# Patient Record
Sex: Male | Born: 1985 | Race: Black or African American | Hispanic: No | Marital: Married | Smoking: Never smoker
Health system: Southern US, Community
[De-identification: ages and names within clinical notes are randomized; demographics above are authoritative.]

## PROBLEM LIST (undated history)

## (undated) HISTORY — PX: OTHER SURGICAL HISTORY: SHX169

---

## 2007-05-01 HISTORY — PX: OTHER SURGICAL HISTORY: SHX169

## 2008-01-11 ENCOUNTER — Emergency Department (HOSPITAL_COMMUNITY): Admission: EM | Admit: 2008-01-11 | Discharge: 2008-01-11 | Payer: Self-pay | Admitting: Emergency Medicine

## 2008-01-12 ENCOUNTER — Emergency Department (HOSPITAL_COMMUNITY): Admission: EM | Admit: 2008-01-12 | Discharge: 2008-01-13 | Payer: Self-pay | Admitting: Emergency Medicine

## 2008-07-23 ENCOUNTER — Emergency Department (HOSPITAL_COMMUNITY): Admission: EM | Admit: 2008-07-23 | Discharge: 2008-07-24 | Payer: Self-pay | Admitting: Emergency Medicine

## 2009-02-09 ENCOUNTER — Emergency Department (HOSPITAL_COMMUNITY): Admission: EM | Admit: 2009-02-09 | Discharge: 2009-02-09 | Payer: Self-pay | Admitting: Family Medicine

## 2009-02-09 ENCOUNTER — Ambulatory Visit (HOSPITAL_COMMUNITY): Admission: RE | Admit: 2009-02-09 | Discharge: 2009-02-09 | Payer: Self-pay | Admitting: Family Medicine

## 2009-06-16 ENCOUNTER — Ambulatory Visit: Payer: Self-pay

## 2010-03-21 IMAGING — CT CT MAXILLOFACIAL W/O CM
3 series · 16 of 47 positions shown, 19 images · non-contrast
Comparison: None

CLINICAL DATA: Injury to the right eye.

CT MAXILLOFACIAL WITHOUT CONTRAST
TECHNIQUE: Multidetector CT imaging of the maxillofacial
structures was performed. Multiplanar CT image reconstructions were
also generated.

[Series 2: supine · axial · 0.33mm/px · z∈[+6,+136]mm · 10 of 62 slices shown, 13 images]
[im 5/62  brain]
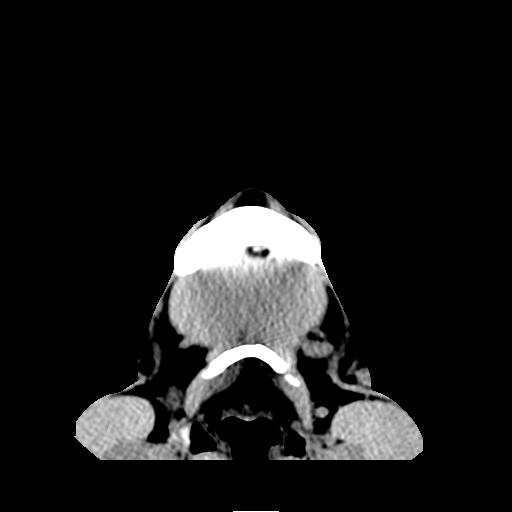
[im 5/62  bone]
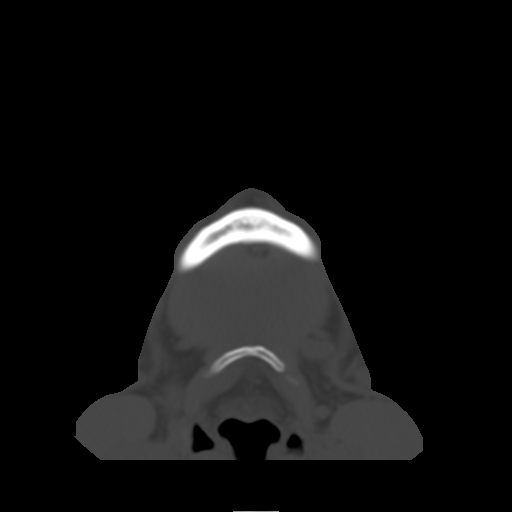
[im 11/62  bone]
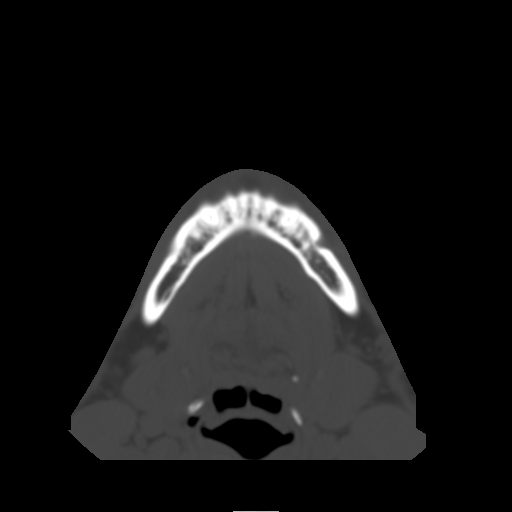
[im 17/62  bone]
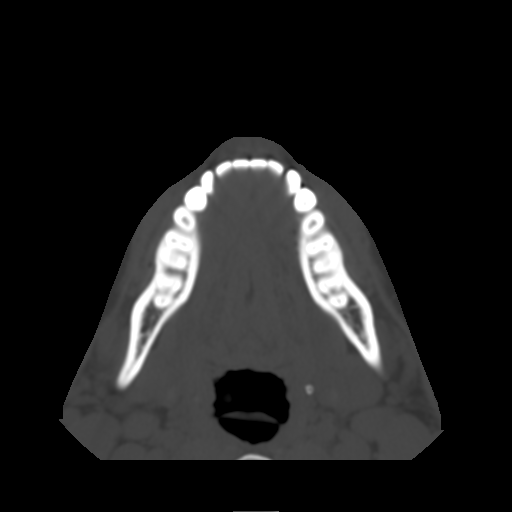
[im 22/62  bone]
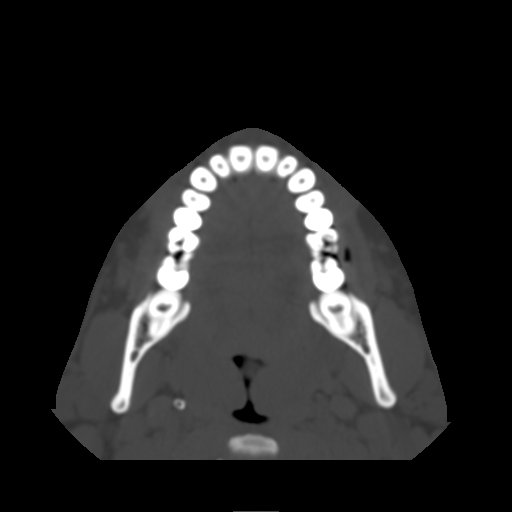
[im 28/62  brain]
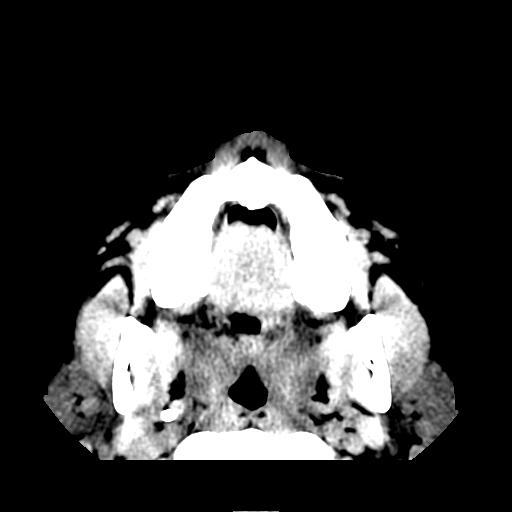
[im 28/62  bone]
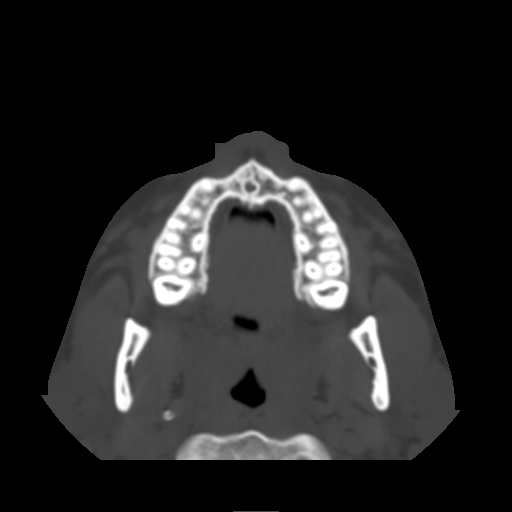
[im 34/62  bone]
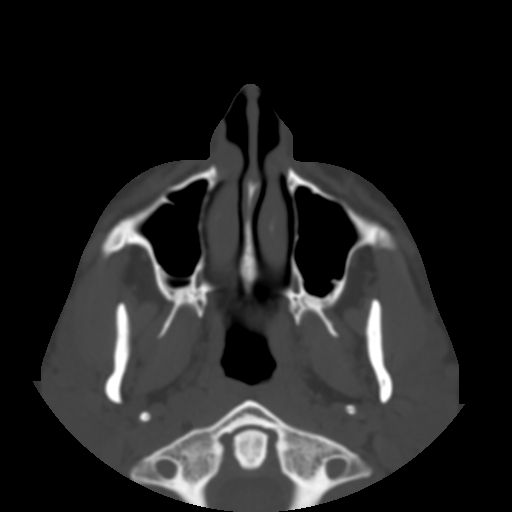
[im 40/62  bone]
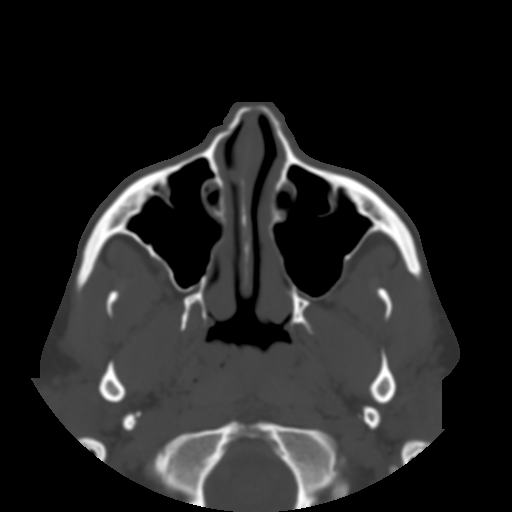
[im 47/62  bone]
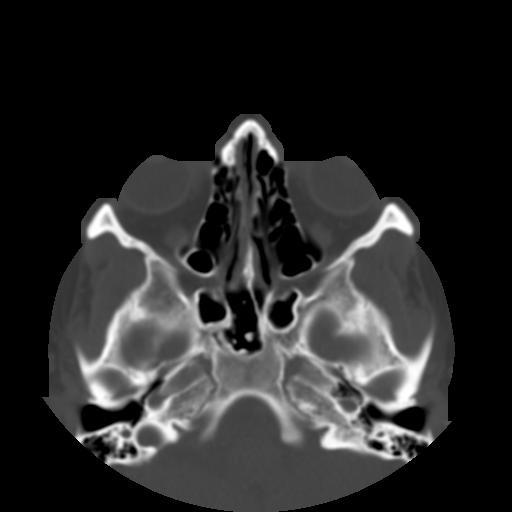
[im 51/62  brain]
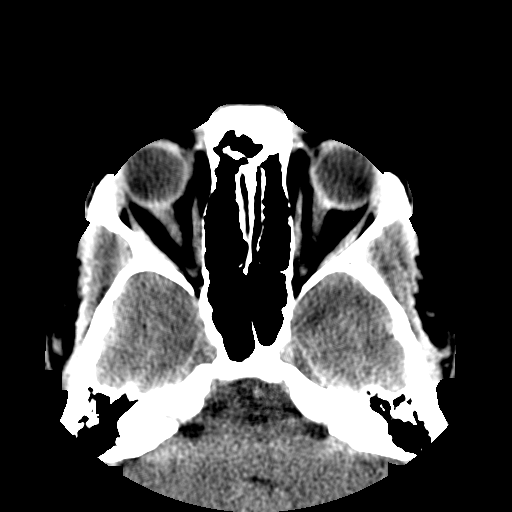
[im 51/62  bone]
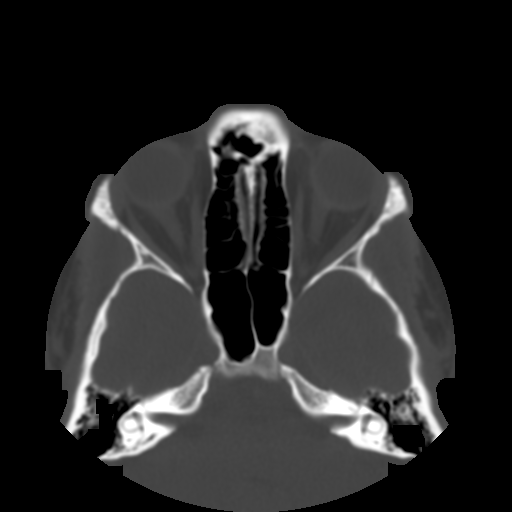
[im 57/62  bone]
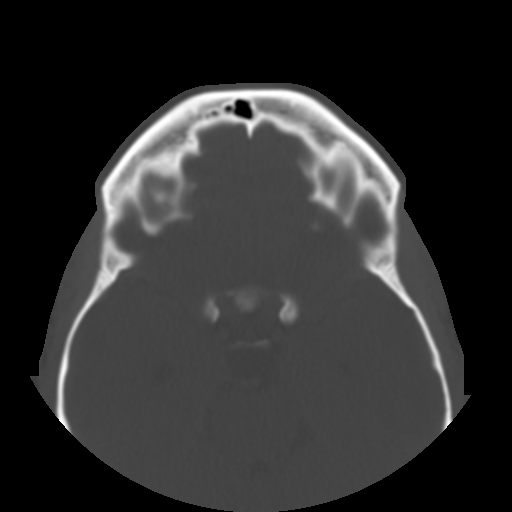

[Series 400: reformatted · sagittal · 0.33mm/px · 3 of 44 slices shown (1 of 2)]
[im 15/44  bone]
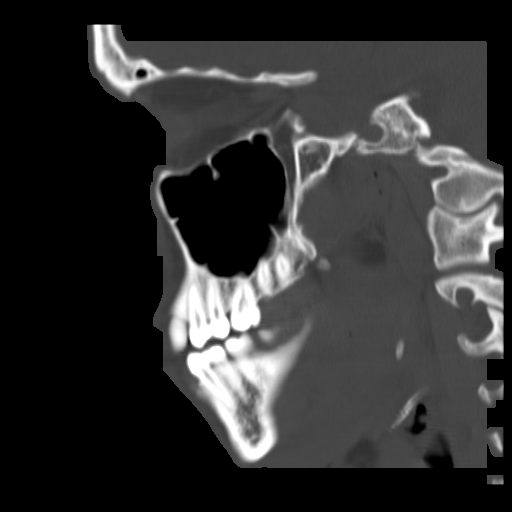
[im 22/44  bone]
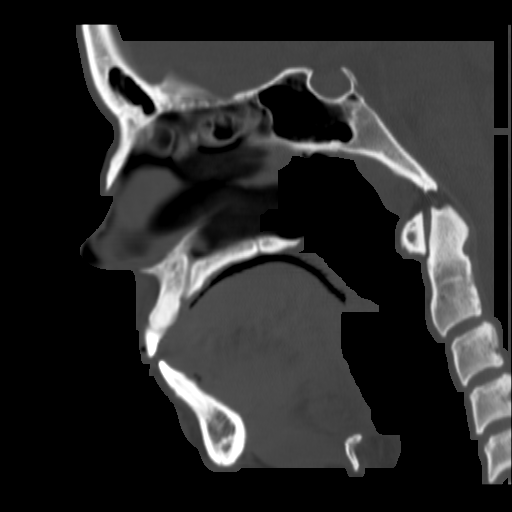
[im 29/44  bone]
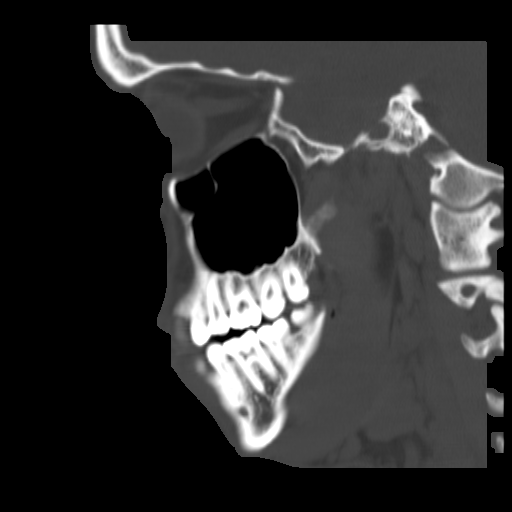

[Series 401: reformatted · coronal · 0.33mm/px · 3 of 35 slices shown (2 of 2)]
[im 12/35  bone]
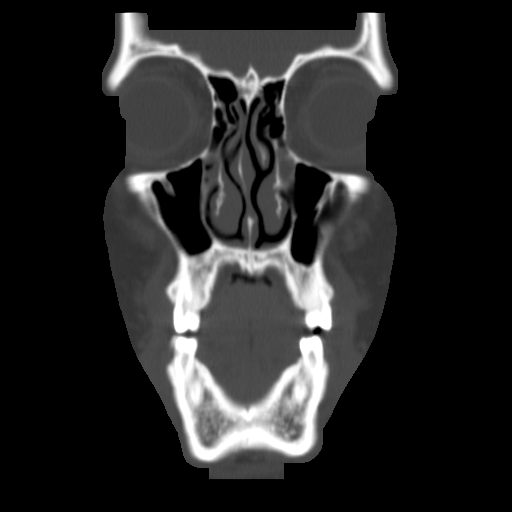
[im 16/35  bone]
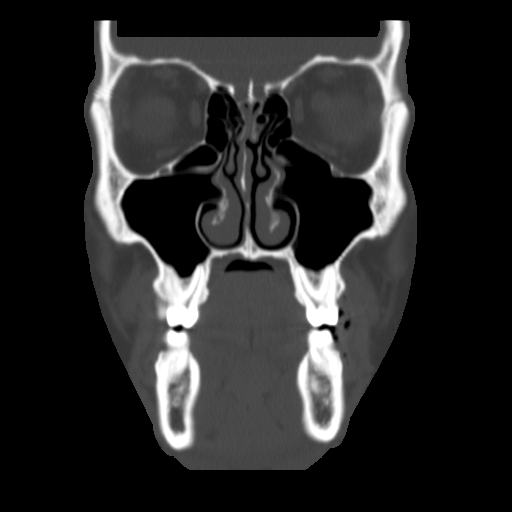
[im 19/35  bone]
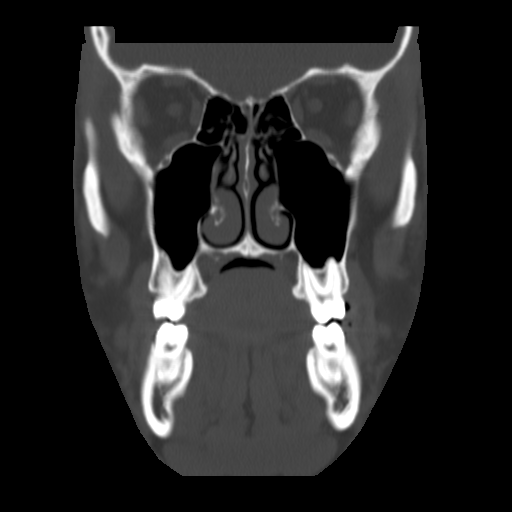

[16 of 47 positions shown; findings below may reference images not displayed]

FINDINGS: The mandibular condyles are located.  The pterygoid
plates are intact.  No evidence for a mandibular fracture.
Visualized mastoid air cells are clear.  There is no significant
paranasal sinus disease.  There is mild deformity of the right
nasal bone and this may represent an old injury.  Negative for a
facial bone fracture.  Both globes are intact.  No evidence for
retrobulbar hemorrhage or edema.  Symmetric appearance of the
extraocular muscles.  Small nodes in the upper neck.  No gross
abnormality to the visualized intracranial structures.  Normal
alignment of the cervical spine from the skull base to C4.  There
is a small curvilinear density along the medial  left orbit that
measures 3 mm.  This density may represent a small calcification or
ossification.
IMPRESSION: No acute bone or soft tissue abnormalities to the face.

Deformity of the right nasal bone may represent an old nasal bone
fracture.

## 2010-05-21 ENCOUNTER — Encounter: Payer: Self-pay | Admitting: Sports Medicine

## 2010-08-10 LAB — RAPID STREP SCREEN (MED CTR MEBANE ONLY): Streptococcus, Group A Screen (Direct): NEGATIVE

## 2018-01-10 ENCOUNTER — Encounter: Payer: Self-pay | Admitting: Family Medicine

## 2018-01-10 ENCOUNTER — Ambulatory Visit: Payer: BLUE CROSS/BLUE SHIELD | Admitting: Family Medicine

## 2018-01-10 VITALS — BP 122/86 | HR 63 | Temp 98.6°F | Ht 75.0 in | Wt 301.4 lb

## 2018-01-10 DIAGNOSIS — Z3141 Encounter for fertility testing: Secondary | ICD-10-CM | POA: Insufficient documentation

## 2018-01-10 DIAGNOSIS — S93691A Other sprain of right foot, initial encounter: Secondary | ICD-10-CM | POA: Insufficient documentation

## 2018-01-10 DIAGNOSIS — M722 Plantar fascial fibromatosis: Secondary | ICD-10-CM | POA: Diagnosis not present

## 2018-01-10 DIAGNOSIS — Z0001 Encounter for general adult medical examination with abnormal findings: Secondary | ICD-10-CM

## 2018-01-10 LAB — URINALYSIS, ROUTINE W REFLEX MICROSCOPIC
Bilirubin Urine: NEGATIVE
HGB URINE DIPSTICK: NEGATIVE
Ketones, ur: NEGATIVE
Leukocytes, UA: NEGATIVE
NITRITE: NEGATIVE
Specific Gravity, Urine: >=1.03 — AB (ref 1.000–1.030)
Total Protein, Urine: NEGATIVE
Urine Glucose: NEGATIVE
Urobilinogen, UA: 0.2 (ref 0.0–1.0)
pH: 6 (ref 5.0–8.0)

## 2018-01-10 LAB — COMPREHENSIVE METABOLIC PANEL
ALBUMIN: 3.6 g/dL (ref 3.5–5.2)
ALT: 30 U/L (ref 0–53)
AST: 23 U/L (ref 0–37)
Alkaline Phosphatase: 69 U/L (ref 39–117)
BUN: 15 mg/dL (ref 6–23)
CALCIUM: 9 mg/dL (ref 8.4–10.5)
CHLORIDE: 109 meq/L (ref 96–112)
CO2: 24 mEq/L (ref 19–32)
Creatinine, Ser: 1.15 mg/dL (ref 0.40–1.50)
GFR: 95.65 mL/min (ref >60.00)
Glucose, Bld: 109 mg/dL — ABNORMAL HIGH (ref 70–99)
POTASSIUM: 4.1 meq/L (ref 3.5–5.1)
Sodium: 141 mEq/L (ref 135–145)
Total Bilirubin: 0.4 mg/dL (ref 0.2–1.2)
Total Protein: 6 g/dL (ref 6.0–8.3)

## 2018-01-10 LAB — CBC
HCT: 47.2 % (ref 39.0–52.0)
HEMOGLOBIN: 16.1 g/dL (ref 13.0–17.0)
MCHC: 34.1 g/dL (ref 30.0–36.0)
MCV: 95.5 fl (ref 78.0–100.0)
Platelets: 233 10*3/uL (ref 150.0–400.0)
RBC: 4.94 Mil/uL (ref 4.22–5.81)
RDW: 12.7 % (ref 11.5–15.5)
WBC: 7.3 10*3/uL (ref 4.0–10.5)

## 2018-01-10 LAB — LIPID PANEL
CHOLESTEROL: 118 mg/dL (ref 0–200)
HDL: 38.4 mg/dL — ABNORMAL LOW (ref >39.00)
LDL CALC: 55 mg/dL (ref 0–99)
NonHDL: 79.7
TRIGLYCERIDES: 124 mg/dL (ref 0.0–149.0)
Total CHOL/HDL Ratio: 3
VLDL: 24.8 mg/dL (ref 0.0–40.0)

## 2018-01-10 LAB — TSH: TSH: 1.23 u[IU]/mL (ref 0.35–4.50)

## 2018-01-10 NOTE — Patient Instructions (Signed)
Health Maintenance, Male A healthy lifestyle and preventive care is important for your health and wellness. Ask your health care provider about what schedule of regular examinations is right for you. What should I know about weight and diet? Eat a Healthy Diet  Eat plenty of vegetables, fruits, whole grains, low-fat dairy products, and lean protein.  Do not eat a lot of foods high in solid fats, added sugars, or salt.  Maintain a Healthy Weight Regular exercise can help you achieve or maintain a healthy weight. You should:  Do at least 150 minutes of exercise each week. The exercise should increase your heart rate and make you sweat (moderate-intensity exercise).  Do strength-training exercises at least twice a week.  Watch Your Levels of Cholesterol and Blood Lipids  Have your blood tested for lipids and cholesterol every 5 years starting at 30 years of age. If you are at high risk for heart disease, you should start having your blood tested when you are 30 years old. You may need to have your cholesterol levels checked more often if: ? Your lipid or cholesterol levels are high. ? You are older than 30 years of age. ? You are at high risk for heart disease.  What should I know about cancer screening? Many types of cancers can be detected early and may often be prevented. Lung Cancer  You should be screened every year for lung cancer if: ? You are a current smoker who has smoked for at least 30 years. ? You are a former smoker who has quit within the past 15 years.  Talk to your health care provider about your screening options, when you should start screening, and how often you should be screened.  Colorectal Cancer  Routine colorectal cancer screening usually begins at 30 years of age and should be repeated every 5-10 years until you are 30 years old. You may need to be screened more often if early forms of precancerous polyps or small growths are found. Your health care provider  may recommend screening at an earlier age if you have risk factors for colon cancer.  Your health care provider may recommend using home test kits to check for hidden blood in the stool.  A small camera at the end of a tube can be used to examine your colon (sigmoidoscopy or colonoscopy). This checks for the earliest forms of colorectal cancer.  Prostate and Testicular Cancer  Depending on your age and overall health, your health care provider may do certain tests to screen for prostate and testicular cancer.  Talk to your health care provider about any symptoms or concerns you have about testicular or prostate cancer.  Skin Cancer  Check your skin from head to toe regularly.  Tell your health care provider about any new moles or changes in moles, especially if: ? There is a change in a mole's size, shape, or color. ? You have a mole that is larger than a pencil eraser.  Always use sunscreen. Apply sunscreen liberally and repeat throughout the day.  Protect yourself by wearing long sleeves, pants, a wide-brimmed hat, and sunglasses when outside.  What should I know about heart disease, diabetes, and high blood pressure?  If you are 18-39 years of age, have your blood pressure checked every 3-5 years. If you are 40 years of age or older, have your blood pressure checked every year. You should have your blood pressure measured twice-once when you are at a hospital or clinic, and once when   you are not at a hospital or clinic. Record the average of the two measurements. To check your blood pressure when you are not at a hospital or clinic, you can use: ? An automated blood pressure machine at a pharmacy. ? A home blood pressure monitor.  Talk to your health care provider about your target blood pressure.  If you are between 45-79 years old, ask your health care provider if you should take aspirin to prevent heart disease.  Have regular diabetes screenings by checking your fasting blood  sugar level. ? If you are at a normal weight and have a low risk for diabetes, have this test once every three years after the age of 45. ? If you are overweight and have a high risk for diabetes, consider being tested at a younger age or more often.  A one-time screening for abdominal aortic aneurysm (AAA) by ultrasound is recommended for men aged 65-75 years who are current or former smokers. What should I know about preventing infection? Hepatitis B If you have a higher risk for hepatitis B, you should be screened for this virus. Talk with your health care provider to find out if you are at risk for hepatitis B infection. Hepatitis C Blood testing is recommended for:  Everyone born from 1945 through 1965.  Anyone with known risk factors for hepatitis C.  Sexually Transmitted Diseases (STDs)  You should be screened each year for STDs including gonorrhea and chlamydia if: ? You are sexually active and are younger than 30 years of age. ? You are older than 30 years of age and your health care provider tells you that you are at risk for this type of infection. ? Your sexual activity has changed since you were last screened and you are at an increased risk for chlamydia or gonorrhea. Ask your health care provider if you are at risk.  Talk with your health care provider about whether you are at high risk of being infected with HIV. Your health care provider may recommend a prescription medicine to help prevent HIV infection.  What else can I do?  Schedule regular health, dental, and eye exams.  Stay current with your vaccines (immunizations).  Do not use any tobacco products, such as cigarettes, chewing tobacco, and e-cigarettes. If you need help quitting, ask your health care provider.  Limit alcohol intake to no more than 2 drinks per day. One drink equals 12 ounces of beer, 5 ounces of wine, or 1 ounces of hard liquor.  Do not use street drugs.  Do not share needles.  Ask your  health care provider for help if you need support or information about quitting drugs.  Tell your health care provider if you often feel depressed.  Tell your health care provider if you have ever been abused or do not feel safe at home. This information is not intended to replace advice given to you by your health care provider. Make sure you discuss any questions you have with your health care provider. Document Released: 10/13/2007 Document Revised: 12/14/2015 Document Reviewed: 01/18/2015 Elsevier Interactive Patient Education  2018 [employer]  Exercising to Lose Weight Exercising can help you to lose weight. In order to lose weight through exercise, you need to do vigorous-intensity exercise. You can tell that you are exercising with vigorous intensity if you are breathing very hard and fast and cannot hold a conversation while exercising. Moderate-intensity exercise helps to maintain your current weight. You can tell that you are exercising   at a moderate level if you have a higher heart rate and faster breathing, but you are still able to hold a conversation. How often should I exercise? Choose an activity that you enjoy and set realistic goals. Your health care provider can help you to make an activity plan that works for you. Exercise regularly as directed by your health care provider. This may include:  Doing resistance training twice each week, such as: ? Push-ups. ? Sit-ups. ? Lifting weights. ? Using resistance bands.  Doing a given intensity of exercise for a given amount of time. Choose from these options: ? 150 minutes of moderate-intensity exercise every week. ? 75 minutes of vigorous-intensity exercise every week. ? A mix of moderate-intensity and vigorous-intensity exercise every week.  Children, pregnant women, people who are out of shape, people who are overweight, and older adults may need to consult a health care provider for individual recommendations. If you have  any sort of medical condition, be sure to consult your health care provider before starting a new exercise program. What are some activities that can help me to lose weight?  Walking at a rate of at least 4.5 miles an hour.  Jogging or running at a rate of 5 miles per hour.  Biking at a rate of at least 10 miles per hour.  Lap swimming.  Roller-skating or in-line skating.  Cross-country skiing.  Vigorous competitive sports, such as football, basketball, and soccer.  Jumping rope.  Aerobic dancing. How can I be more active in my day-to-day activities?  Use the stairs instead of the elevator.  Take a walk during your lunch break.  If you drive, park your car farther away from work or school.  If you take public transportation, get off one stop early and walk the rest of the way.  Make all of your phone calls while standing up and walking around.  Get up, stretch, and walk around every 30 minutes throughout the day. What guidelines should I follow while exercising?  Do not exercise so much that you hurt yourself, feel dizzy, or get very short of breath.  Consult your health care provider prior to starting a new exercise program.  Wear comfortable clothes and shoes with good support.  Drink plenty of water while you exercise to prevent dehydration or heat stroke. Body water is lost during exercise and must be replaced.  Work out until you breathe faster and your heart beats faster. This information is not intended to replace advice given to you by your health care provider. Make sure you discuss any questions you have with your health care provider. Document Released: 05/19/2010 Document Revised: 09/22/2015 Document Reviewed: 09/17/2013 Elsevier Interactive Patient Education  2018 [employer].  Obesity, Adult Obesity is the condition of having too much total body fat. Being overweight or obese means that your weight is greater than what is considered healthy for your  body size. Obesity is determined by a measurement called BMI. BMI is an estimate of body fat and is calculated from height and weight. For adults, a BMI of 30 or higher is considered obese. Obesity can eventually lead to other health concerns and major illnesses, including:  Stroke.  Coronary artery disease (CAD).  Type 2 diabetes.  Some types of cancer, including cancers of the colon, breast, uterus, and gallbladder.  Osteoarthritis.  High blood pressure (hypertension).  High cholesterol.  Sleep apnea.  Gallbladder stones.  Infertility problems.  What are the causes? The main cause of obesity is  taking in (consuming) more calories than your body uses for energy. Other factors that contribute to this condition may include:  Being born with genes that make you more likely to become obese.  Having a medical condition that causes obesity. These conditions include: ? Hypothyroidism. ? Polycystic ovarian syndrome (PCOS). ? Binge-eating disorder. ? Cushing syndrome.  Taking certain medicines, such as steroids, antidepressants, and seizure medicines.  Not being physically active (sedentary lifestyle).  Living where there are limited places to exercise safely or buy healthy foods.  Not getting enough sleep.  What increases the risk? The following factors may increase your risk of this condition:  Having a family history of obesity.  Being a woman of African-American descent.  Being a man of Hispanic descent.  What are the signs or symptoms? Having excessive body fat is the main symptom of this condition. How is this diagnosed? This condition may be diagnosed based on:  Your symptoms.  Your medical history.  A physical exam. Your health care provider may measure: ? Your BMI. If you are an adult with a BMI between 25 and less than 30, you are considered overweight. If you are an adult with a BMI of 30 or higher, you are considered obese. ? The distances around your  hips and your waist (circumferences). These may be compared to each other to help diagnose your condition. ? Your skinfold thickness. Your health care provider may gently pinch a fold of your skin and measure it.  How is this treated? Treatment for this condition often includes changing your lifestyle. Treatment may include some or all of the following:  Dietary changes. Work with your health care provider and a dietitian to set a weight-loss goal that is healthy and reasonable for you. Dietary changes may include eating: ? Smaller portions. A portion size is the amount of a particular food that is healthy for you to eat at one time. This varies from person to person. ? Low-calorie or low-fat options. ? More whole grains, fruits, and vegetables.  Regular physical activity. This may include aerobic activity (cardio) and strength training.  Medicine to help you lose weight. Your health care provider may prescribe medicine if you are unable to lose 1 pound a week after 6 weeks of eating more healthily and doing more physical activity.  Surgery. Surgical options may include gastric banding and gastric bypass. Surgery may be done if: ? Other treatments have not helped to improve your condition. ? You have a BMI of 40 or higher. ? You have life-threatening health problems related to obesity.  Follow these instructions at home:  Eating and drinking   Follow recommendations from your health care provider about what you eat and drink. Your health care provider may advise you to: ? Limit fast foods, sweets, and processed snack foods. ? Choose low-fat options, such as low-fat milk instead of whole milk. ? Eat 5 or more servings of fruits or vegetables every day. ? Eat at home more often. This gives you more control over what you eat. ? Choose healthy foods when you eat out. ? Learn what a healthy portion size is. ? Keep low-fat snacks on hand. ? Avoid sugary drinks, such as soda, fruit juice,  iced tea sweetened with sugar, and flavored milk. ? Eat a healthy breakfast.  Drink enough water to keep your urine clear or pale yellow.  Do not go without eating for long periods of time (do not fast) or follow a fad diet. Fasting and  fad diets can be unhealthy and even dangerous. Physical Activity  Exercise regularly, as told by your health care provider. Ask your health care provider what types of exercise are safe for you and how often you should exercise.  Warm up and stretch before being active.  Cool down and stretch after being active.  Rest between periods of activity. Lifestyle  Limit the time that you spend in front of your TV, computer, or video game system.  Find ways to reward yourself that do not involve food.  Limit alcohol intake to no more than 1 drink a day for nonpregnant women and 2 drinks a day for men. One drink equals 12 oz of beer, 5 oz of wine, or 1 oz of hard liquor. General instructions  Keep a weight loss journal to keep track of the food you eat and how much you exercise you get.  Take over-the-counter and prescription medicines only as told by your health care provider.  Take vitamins and supplements only as told by your health care provider.  Consider joining a support group. Your health care provider may be able to recommend a support group.  Keep all follow-up visits as told by your health care provider. This is important. Contact a health care provider if:  You are unable to meet your weight loss goal after 6 weeks of dietary and lifestyle changes. This information is not intended to replace advice given to you by your health care provider. Make sure you discuss any questions you have with your health care provider. Document Released: 05/24/2004 Document Revised: 09/19/2015 Document Reviewed: 02/02/2015 Elsevier Interactive Patient Education  2018 [employer] Inc.  Plantar Fasciitis Plantar fasciitis is a painful foot condition that affects  the heel. It occurs when the band of tissue that connects the toes to the heel bone (plantar fascia) becomes irritated. This can happen after exercising too much or doing other repetitive activities (overuse injury). The pain from plantar fasciitis can range from mild irritation to severe pain that makes it difficult for you to walk or move. The pain is usually worse in the morning or after you have been sitting or lying down for a while. What are the causes? This condition may be caused by:  Standing for long periods of time.  Wearing shoes that do not fit.  Doing high-impact activities, including running, aerobics, and ballet.  Being overweight.  Having an abnormal way of walking (gait).  Having tight calf muscles.  Having high arches in your feet.  Starting a new athletic activity.  What are the signs or symptoms? The main symptom of this condition is heel pain. Other symptoms include:  Pain that gets worse after activity or exercise.  Pain that is worse in the morning or after resting.  Pain that goes away after you walk for a few minutes.  How is this diagnosed? This condition may be diagnosed based on your signs and symptoms. Your health care provider will also do a physical exam to check for:  A tender area on the bottom of your foot.  A high arch in your foot.  Pain when you move your foot.  Difficulty moving your foot.  You may also need to have imaging studies to confirm the diagnosis. These can include:  X-rays.  Ultrasound.  MRI.  How is this treated? Treatment for plantar fasciitis depends on the severity of the condition. Your treatment may include:  Rest, ice, and over-the-counter pain medicines to manage your pain.  Exercises to  stretch your calves and your plantar fascia.  A splint that holds your foot in a stretched, upward position while you sleep (night splint).  Physical therapy to relieve symptoms and prevent problems in the  future.  Cortisone injections to relieve severe pain.  Extracorporeal shock wave therapy (ESWT) to stimulate damaged plantar fascia with electrical impulses. It is often used as a last resort before surgery.  Surgery, if other treatments have not worked after 12 months.  Follow these instructions at home:  Take medicines only as directed by your health care provider.  Avoid activities that cause pain.  Roll the bottom of your foot over a bag of ice or a bottle of cold water. Do this for 20 minutes, 3-4 times a day.  Perform simple stretches as directed by your health care provider.  Try wearing athletic shoes with air-sole or gel-sole cushions or soft shoe inserts.  Wear a night splint while sleeping, if directed by your health care provider.  Keep all follow-up appointments with your health care provider. How is this prevented?  Do not perform exercises or activities that cause heel pain.  Consider finding low-impact activities if you continue to have problems.  Lose weight if you need to. The best way to prevent plantar fasciitis is to avoid the activities that aggravate your plantar fascia. Contact a health care provider if:  Your symptoms do not go away after treatment with home care measures.  Your pain gets worse.  Your pain affects your ability to move or do your daily activities. This information is not intended to replace advice given to you by your health care provider. Make sure you discuss any questions you have with your health care provider. Document Released: 01/09/2001 Document Revised: 09/19/2015 Document Reviewed: 02/24/2014 Elsevier Interactive Patient Education  [employer]2018 [employer] Inc.

## 2018-01-10 NOTE — Progress Notes (Addendum)
Subjective:  Patient ID: Vincent Hale, male    DOB: 1985-10-19  Age: 32 y.o. MRN: [id number]  CC: New Patient (Initial Visit) (CPE, not fasting (had a powerbar))   HPI Vincent Hale presents for a complete physical exam and to establish care.  He has some medical issues he would also like to discuss.  He is interested in having his thyroid checked with regards to his weight.  He feels as though he consumes a healthy diet and still has difficulty losing weight.  He goes to the gym sometimes twice a week.  He engages in cardio and weightlifting.  He also has pain in the heel of his right foot that is more pronounced in the morning when he first arises or after sitting.  There is been no injury there.  He lives with his significant other and brother.  He works at [employer].  He drinks alcohol maybe 2 or 3 times a month.  He does not smoke or use illicit drugs.  His father is 58 and has a history of hypertension and is status post MI after surgical procedure.  His mother is in her upper 43s and has arthritis in both of her knees.  He and his significant other are trying to achieve pregnancy and she has asked him to ask me about a sperm analysis.  He tells me that he is pretty easy-going and he takes a lot to get him upset.  History Hassan has no past medical history on file.   He has no past surgical history on file.   His family history is not on file.He reports that he has never smoked. He has never used smokeless tobacco. His alcohol and drug histories are not on file.  No outpatient medications prior to visit.   No facility-administered medications prior to visit.     ROS Review of Systems  Constitutional: Negative for chills, fatigue, fever and unexpected weight change.  HENT: Negative.   Eyes: Negative for photophobia and visual disturbance.  Respiratory: Negative.   Cardiovascular: Negative.   Gastrointestinal: Negative.   Endocrine: Negative for polyphagia and  polyuria.  Genitourinary: Negative.   Musculoskeletal: Positive for arthralgias. Negative for myalgias.  Skin: Negative for rash.  Allergic/Immunologic: Negative for immunocompromised state.  Neurological: Negative for headaches.  Hematological: Negative for adenopathy.  Psychiatric/Behavioral: Negative.     Objective:  BP 122/86 (BP Location: Left Arm, Patient Position: Sitting, Cuff Size: Large)   Pulse 63   Temp 98.6 F (37 C) (Oral)   Ht 6\' 3"  (1.905 m)   Wt (!) 301 lb 6.4 oz (136.7 kg)   SpO2 96%   BMI 37.67 kg/m   Physical Exam  Constitutional: He is oriented to person, place, and time. He appears well-developed and well-nourished. No distress.  HENT:  Head: Normocephalic and atraumatic.  Right Ear: External ear normal.  Left Ear: External ear normal.  Mouth/Throat: Oropharynx is clear and moist. No oropharyngeal exudate.  Eyes: Pupils are equal, round, and reactive to light. Conjunctivae and EOM are normal. Right eye exhibits no discharge. Left eye exhibits no discharge. No scleral icterus.  Neck: Normal range of motion. Neck supple. No JVD present. No tracheal deviation present. No thyromegaly present.  Cardiovascular: Normal rate, regular rhythm and normal heart sounds.  Pulses:      Popliteal pulses are 2+ on the right side.       Dorsalis pedis pulses are 2+ on the right side.  Pulmonary/Chest: Effort normal and  breath sounds normal.  Abdominal: Soft. Bowel sounds are normal. He exhibits no distension and no mass. There is no tenderness. There is no guarding. Hernia confirmed negative in the right inguinal area and confirmed negative in the left inguinal area.  Genitourinary: Penis normal. Right testis shows no mass, no swelling and no tenderness. Right testis is descended. Left testis shows no swelling and no tenderness. Left testis is descended. Circumcised. No hypospadias, penile erythema or penile tenderness. No discharge found.  Musculoskeletal:        Feet:  Lymphadenopathy:    He has no cervical adenopathy. No inguinal adenopathy noted on the right or left side.  Neurological: He is alert and oriented to person, place, and time.  Skin: Skin is warm and dry. He is not diaphoretic.  Psychiatric: He has a normal mood and affect. His behavior is normal.      Assessment & Plan:   Vincent Hale was seen today for new patient (initial visit).  Diagnoses and all orders for this visit:  Encounter for health maintenance examination with abnormal findings -     CBC -     Comprehensive metabolic panel -     HIV Antibody (routine testing w rflx) -     Lipid panel -     TSH -     Urinalysis, Routine w reflex microscopic  Morbid obesity (HCC) -     TSH -     Amb ref to Medical Nutrition Therapy-MNT  Plantar fasciitis, right  Fertility testing -     Semen Analysis, Basic   Vincent RobertAlexander J. Hale does not currently have medications on file.  No orders of the defined types were placed in this encounter.  Will draw labs today for health maintenance.  Patient was given anticipatory guidance on health promotion and disease prevention.  He was given general information on exercising to lose weight.  Advised him to engage in a nonweightbearing aerobic activity.  He was given general information on obesity and plantar fasciitis.  He is interested in pursuing nutritional counseling.  Will order a sperm analysis upon his request.   Follow-up: Return in about 6 months (around 07/11/2018).  Mliss SaxWilliam Alfred Mansur Patti, MD

## 2018-01-11 LAB — HIV ANTIBODY (ROUTINE TESTING W REFLEX): HIV: NONREACTIVE

## 2018-01-13 ENCOUNTER — Encounter: Payer: Self-pay | Admitting: Family Medicine

## 2018-01-13 NOTE — Addendum Note (Signed)
Addended by: Andrez GrimeKREMER, WILLIAM A on: 01/13/2018 10:28 AM   Modules accepted: Orders

## 2018-01-22 ENCOUNTER — Other Ambulatory Visit: Payer: Self-pay | Admitting: Family Medicine

## 2018-01-23 ENCOUNTER — Encounter: Payer: BLUE CROSS/BLUE SHIELD | Attending: Family Medicine | Admitting: Skilled Nursing Facility1

## 2018-01-23 ENCOUNTER — Encounter: Payer: Self-pay | Admitting: Skilled Nursing Facility1

## 2018-01-23 DIAGNOSIS — Z713 Dietary counseling and surveillance: Secondary | ICD-10-CM | POA: Diagnosis present

## 2018-01-23 NOTE — Progress Notes (Signed)
  Assessment:  Primary concerns today: obesity.   Pt states he is not comfortable with his weight. Pt states he has been around 280-300 pounds for the last 3 years. Pt states before that he was about 240 pounds and played basketball every day now he no longer plays basketball. Pt states he would like to be 240-250 pounds.  Pt states he might check in in 2 months.   TANITA  BODY COMP RESULTS  01/23/2018   BMI (kg/m^2) 37.5   Fat Mass (lbs) 121.2   Fat Free Mass (lbs) 178.6   Total Body Water (lbs) 134.6    MEDICATIONS: See List   DIETARY INTAKE:  Usual eating pattern includes 3 meals and 2 snacks per day.  Everyday foods include listed below.  Avoided foods include simple/complex carbohydrates    24-hr recall: sometimes eating out 1 time a week B ( AM): onion jalapeno green peppers poured egg whites maybe 3-4 maybe sometimes a grapefruit Snk ( AM): honey nut cheerios L ( PM): onion jalapano peppers and shrimp or air fried chicken Snk ( PM): grapes  D ( PM): onion jalapano peppers and shrimp or air fried chicken Snk ( PM):  Beverages: gallon of water  Usual physical activity: 5-6 days a week for 60 minutes bike and lifting 5 days weeks  Estimated energy needs: 2000 calories 225 g carbohydrates 150 g protein 56 g fat  Progress Towards Goal(s):  In progress.    Intervention:  Nutrition counseling for weight management. Dietitian educated the pt on the need to consume complex carbohydrate to build musculature and utilize his fat mass.  Goals: -2 cups of vegetables or more if hungry per meal, 3 carb choices per meal, 4 ounces of protein per meal -Increase the intensity of your biking  -Continue your activity 5 days a week  Teaching Method Utilized:  Visual Auditory Hands on  Handouts given during visit include:  Meal ideas sheet  Barriers to learning/adherence to lifestyle change: none identified   Demonstrated degree of understanding via:  Teach Back    Monitoring/Evaluation:  Dietary intake, exercise, and body weight prn.

## 2018-01-27 LAB — SEMEN ANALYSIS, BASIC
Appearance: NORMAL
CONCENTRATION, SPERM: 88.7 x10E6/mL (ref >14.9)
Immotile Sperm: 45 %
Leukocyte Concentration: <1 x10E6/mL (ref <1.00)
Non-Progressive (NP): 7 %
Normal Morphology-Strict: 12 % (ref >3)
Progressive Motility (PR): 48 % (ref >31)
Progressively Motile Sperm: 196.4 x10E6
TIME RECEIVED: 1005
TOTAL MOTILE SPERM: 225.9 x10E6
TOTAL SPERM IN EJACULATE: 408.1 x10E6 (ref >38.9)
Time Collected: 915
Time Since Last Emission: 3 days
Total Motility (PR+NP): 55 % (ref >39)
Volume: 4.6 mL (ref >1.4)
pH: 8.5 (ref >7.1)

## 2018-06-13 ENCOUNTER — Ambulatory Visit: Payer: BLUE CROSS/BLUE SHIELD | Admitting: Family Medicine

## 2018-07-07 ENCOUNTER — Ambulatory Visit: Payer: BLUE CROSS/BLUE SHIELD | Admitting: Family Medicine

## 2018-07-07 ENCOUNTER — Encounter: Payer: Self-pay | Admitting: Family Medicine

## 2018-07-07 VITALS — BP 128/70 | HR 67 | Temp 98.2°F | Ht 75.0 in | Wt 308.5 lb

## 2018-07-07 DIAGNOSIS — H6981 Other specified disorders of Eustachian tube, right ear: Secondary | ICD-10-CM

## 2018-07-07 DIAGNOSIS — H66001 Acute suppurative otitis media without spontaneous rupture of ear drum, right ear: Secondary | ICD-10-CM | POA: Diagnosis not present

## 2018-07-07 DIAGNOSIS — R0981 Nasal congestion: Secondary | ICD-10-CM | POA: Insufficient documentation

## 2018-07-07 DIAGNOSIS — H6991 Unspecified Eustachian tube disorder, right ear: Secondary | ICD-10-CM | POA: Insufficient documentation

## 2018-07-07 MED ORDER — PREDNISONE 10 MG PO TABS: 10.0000 mg | ORAL_TABLET | Freq: Every day | ORAL | 0 refills | Status: AC

## 2018-07-07 MED ORDER — FLUTICASONE PROPIONATE 50 MCG/ACT NA SUSP: 2.0000 | Freq: Every day | NASAL | 6 refills | Status: DC

## 2018-07-07 MED ORDER — CLARITHROMYCIN ER 500 MG PO TB24: 1000.0000 mg | ORAL_TABLET | Freq: Every day | ORAL | 0 refills | Status: AC

## 2018-07-07 NOTE — Patient Instructions (Signed)

## 2018-07-07 NOTE — Progress Notes (Signed)
Established Patient Office Visit  Subjective:  Patient ID: Vincent Hale, male    DOB: May 25, 1985  Age: 33 y.o. MRN: [id number]  CC:  Chief Complaint  Patient presents with  . Ear Pain    right ear, seen at urgent care & given amoxicillin. completed antibiotic and started to feel better, came back a few days later. Has been out of town.     HPI Vincent Hale presents for follow-up of an otitis media affecting his right ear.  It is been treated for 10 days with amoxicillin.  Ear seemed to clear up and then symptoms returned a few days later.  He is having ongoing nasal congestion with this.  Denies fever chills, ear pain postnasal drip cough reactive airway disease.  Hearing remains muffled.  He was given a nasal preparation to use for this.    History reviewed. No pertinent past medical history.  Past Surgical History:  Procedure Laterality Date  . arthroscopic knee surgery    . tear in meniscus  2009    Family History  Problem Relation Age of Onset  . Arthritis Mother   . Heart attack Father   . Hyperlipidemia Father   . Hypertension Father     Social History   Socioeconomic History  . Marital status: Single    Spouse name: Not on file  . Number of children: Not on file  . Years of education: Not on file  . Highest education level: Not on file  Occupational History  . Not on file  Social Needs  . Financial resource strain: Not on file  . Food insecurity:    Worry: Not on file    Inability: Not on file  . Transportation needs:    Medical: Not on file    Non-medical: Not on file  Tobacco Use  . Smoking status: Never Smoker  . Smokeless tobacco: Never Used  Substance and Sexual Activity  . Alcohol use: Not on file  . Drug use: Not on file  . Sexual activity: Not on file  Lifestyle  . Physical activity:    Days per week: Not on file    Minutes per session: Not on file  . Stress: Not on file  Relationships  . Social connections:    Talks on phone:  Not on file    Gets together: Not on file    Attends religious service: Not on file    Active member of club or organization: Not on file    Attends meetings of clubs or organizations: Not on file    Relationship status: Not on file  . Intimate partner violence:    Fear of current or ex partner: Not on file    Emotionally abused: Not on file    Physically abused: Not on file    Forced sexual activity: Not on file  Other Topics Concern  . Not on file  Social History Narrative  . Not on file    No outpatient medications prior to visit.   No facility-administered medications prior to visit.     No Known Allergies  ROS Review of Systems  Constitutional: Negative for chills, diaphoresis, fatigue, fever and unexpected weight change.  HENT: Positive for congestion and hearing loss. Negative for ear pain, postnasal drip, rhinorrhea, sinus pressure, sinus pain, sneezing and sore throat.   Eyes: Negative for photophobia and visual disturbance.  Respiratory: Negative for cough, shortness of breath and wheezing.   Cardiovascular: Negative.   Gastrointestinal: Negative.  Endocrine: Negative for polyphagia and polyuria.  Genitourinary: Negative.   Musculoskeletal: Negative for arthralgias and myalgias.  Allergic/Immunologic: Negative for immunocompromised state.  Neurological: Negative for headaches.  Hematological: Does not bruise/bleed easily.  Psychiatric/Behavioral: Negative.       Objective:    Physical Exam  Constitutional: He is oriented to person, place, and time. He appears well-developed and well-nourished. No distress.  HENT:  Head: Normocephalic and atraumatic.  Right Ear: External ear normal. No foreign bodies. Tympanic membrane is injected, erythematous and retracted. Tympanic membrane is not perforated. A middle ear effusion is present.  Left Ear: Tympanic membrane, external ear and ear canal normal.  Mouth/Throat: Oropharynx is clear and moist. No oropharyngeal  exudate.    Eyes: Pupils are equal, round, and reactive to light. Conjunctivae are normal. Right eye exhibits no discharge. Left eye exhibits no discharge. No scleral icterus.  Neck: Neck supple. No JVD present. No tracheal deviation present. No thyromegaly present.  Cardiovascular: Normal rate, regular rhythm and normal heart sounds.  Pulmonary/Chest: Effort normal and breath sounds normal. No stridor.  Abdominal: Bowel sounds are normal.  Lymphadenopathy:    He has no cervical adenopathy.  Neurological: He is alert and oriented to person, place, and time.  Skin: Skin is warm and dry. He is not diaphoretic.  Psychiatric: He has a normal mood and affect. His behavior is normal.    BP 128/70   Pulse 67   Temp 98.2 F (36.8 C) (Oral)   Ht  (1.905 m)   Wt (!) 308 lb 8 oz (139.9 kg)   SpO2 96%   BMI 38.56 kg/m  Wt Readings from Last 3 Encounters:  07/07/18 (!) 308 lb 8 oz (139.9 kg)  01/23/18 299 lb 12.8 oz (136 kg)  01/10/18 (!) 301 lb 6.4 oz (136.7 kg)   BP Readings from Last 3 Encounters:  07/07/18 128/70  01/10/18 122/86   Guideline developer:  UpToDate (see UpToDate for funding source) Date Released: June 2014  Health Maintenance Due  Topic Date Due  . Janet Berlin  06/12/2006    There are no preventive care reminders to display for this patient.  Lab Results  Component Value Date   TSH 1.23 01/10/2018   Lab Results  Component Value Date   WBC 7.3 01/10/2018   HGB 16.1 01/10/2018   HCT 47.2 01/10/2018   MCV 95.5 01/10/2018   PLT 233.0 01/10/2018   Lab Results  Component Value Date   NA 141 01/10/2018   K 4.1 01/10/2018   CO2 24 01/10/2018   GLUCOSE 109 (H) 01/10/2018   BUN 15 01/10/2018   CREATININE 1.15 01/10/2018   BILITOT 0.4 01/10/2018   ALKPHOS 69 01/10/2018   AST 23 01/10/2018   ALT 30 01/10/2018   PROT 6.0 01/10/2018   ALBUMIN 3.6 01/10/2018   CALCIUM 9.0 01/10/2018   GFR 95.65 01/10/2018   Lab Results  Component Value Date   CHOL  118 01/10/2018   Lab Results  Component Value Date   HDL 38.40 (L) 01/10/2018   Lab Results  Component Value Date   LDLCALC 55 01/10/2018   Lab Results  Component Value Date   TRIG 124.0 01/10/2018   Lab Results  Component Value Date   CHOLHDL 3 01/10/2018   No results found for: HGBA1C    Assessment & Plan:   Problem List Items Addressed This Visit      Nervous and Auditory   Acute suppurative otitis media of right ear without spontaneous rupture  of tympanic membrane - Primary   Relevant Medications   clarithromycin (BIAXIN XL) 500 MG 24 hr tablet   fluticasone (FLONASE) 50 MCG/ACT nasal spray   predniSONE (DELTASONE) 10 MG tablet   ETD (Eustachian tube dysfunction), right   Relevant Medications   fluticasone (FLONASE) 50 MCG/ACT nasal spray   predniSONE (DELTASONE) 10 MG tablet     Other   Nasal congestion   Relevant Medications   fluticasone (FLONASE) 50 MCG/ACT nasal spray   predniSONE (DELTASONE) 10 MG tablet      Meds ordered this encounter  Medications  . clarithromycin (BIAXIN XL) 500 MG 24 hr tablet    Sig: Take 2 tablets (1,000 mg total) by mouth daily for 10 days.    Dispense:  20 tablet    Refill:  0  . fluticasone (FLONASE) 50 MCG/ACT nasal spray    Sig: Place 2 sprays into both nostrils daily.    Dispense:  16 g    Refill:  6  . predniSONE (DELTASONE) 10 MG tablet    Sig: Take 1 tablet (10 mg total) by mouth daily with breakfast for 5 days.    Dispense:  5 tablet    Refill:  0    Follow-up: Return in about 2 weeks (around 07/21/2018).   Patient was given information on eustachian tube dysfunction.

## 2018-08-26 ENCOUNTER — Telehealth: Payer: Self-pay | Admitting: Family Medicine

## 2018-08-26 NOTE — Telephone Encounter (Signed)
I called and spoke to patient. Patient would like to wait and schedule follow up appointment when we are seeing patients in the office.

## 2019-12-02 ENCOUNTER — Encounter: Payer: Self-pay | Admitting: Family Medicine

## 2020-07-11 NOTE — Progress Notes (Signed)
New Patient Office Visit  Subjective:  Patient ID: Vincent Hale, male    DOB: [birth year]  Age: 35 y.o. MRN: [id number]  CC:  Chief Complaint  Patient presents with  . Establish Care    HPI Vincent Hale presents to establish care.   Dentist: overdue, next on to do list Eye: overdue, no correction, no concerns Exercise: lifts weights and cardio 4-5 times weekly Diet: cutting back on sugars for the last month, eats twice a day (11am and 6pm) baked chicken and veggies, eggs, [place] bacon COVID: done, boosted  Right knee pain- history of meniscal tear and arthroscopic surgery. Recently has begun hurting again. Plays basketball and exercises regularly. About 2 months ago, had an episode of locking up and hyperextending it so has recently cut back on activities that stress that knee.   Left low back pain for the last few weeks. No known injury or strain. No radiculopathy symptoms or red flags.   History reviewed. No pertinent past medical history.  Past Surgical History:  Procedure Laterality Date  . arthroscopic knee surgery    . tear in meniscus  2009    Family History  Problem Relation Age of Onset  . Arthritis Mother   . Heart attack Father   . Hyperlipidemia Father   . Hypertension Father     Social History   Socioeconomic History  . Marital status: Married    Spouse name: Not on file  . Number of children: Not on file  . Years of education: Not on file  . Highest education level: Not on file  Occupational History  . Not on file  Tobacco Use  . Smoking status: Never Smoker  . Smokeless tobacco: Never Used  Vaping Use  . Vaping Use: Never used  Substance and Sexual Activity  . Alcohol use: Yes    Comment: occasionally  . Drug use: Never  . Sexual activity: Yes    Birth control/protection: None  Other Topics Concern  . Not on file  Social History Narrative  . Not on file   Social Determinants of Health   Financial Resource Strain: Not on file   Food Insecurity: Not on file  Transportation Needs: Not on file  Physical Activity: Not on file  Stress: Not on file  Social Connections: Not on file  Intimate Partner Violence: Not on file    ROS Review of Systems  Constitutional: Negative for chills, fatigue, fever and unexpected weight change.  HENT: Negative for congestion, rhinorrhea, sinus pressure and sore throat.   Eyes: Negative for visual disturbance.  Respiratory: Negative for cough, chest tightness, shortness of breath and wheezing.   Cardiovascular: Negative for chest pain, palpitations and leg swelling.  Gastrointestinal: Negative for abdominal pain, constipation, diarrhea, nausea and vomiting.  Genitourinary: Negative for dysuria, frequency and urgency.  Musculoskeletal: Positive for arthralgias (right knee) and back pain (left lower lumbar).  Skin: Negative for rash.  Allergic/Immunologic: Negative for environmental allergies and food allergies.  Neurological: Positive for headaches. Negative for dizziness and light-headedness.  Psychiatric/Behavioral: Negative for dysphoric mood, self-injury, sleep disturbance and suicidal ideas. The patient is not nervous/anxious.     Objective:   Today's Vitals: BP 136/72   Pulse 71   Temp 98.6 F (37 C)   Ht 6\' 4"  (1.93 m)   Wt (!) 313 lb 4.8 oz (142.1 kg)   SpO2 96%   BMI 38.14 kg/m   Physical Exam Vitals and nursing note reviewed.  Constitutional:  General: He is not in acute distress.    Appearance: Normal appearance.  HENT:     Head: Normocephalic and atraumatic.     Right Ear: Tympanic membrane, ear canal and external ear normal. There is no impacted cerumen.     Left Ear: Tympanic membrane, ear canal and external ear normal. There is no impacted cerumen.  Neck:     Vascular: No carotid bruit.  Cardiovascular:     Rate and Rhythm: Normal rate and regular rhythm.     Pulses: Normal pulses.     Heart sounds: Normal heart sounds. No murmur heard. No  friction rub. No gallop.   Pulmonary:     Effort: Pulmonary effort is normal. No respiratory distress.     Breath sounds: Normal breath sounds.  Abdominal:     General: Bowel sounds are normal. There is no distension.     Palpations: Abdomen is soft. There is no mass.     Tenderness: There is no abdominal tenderness. There is no guarding or rebound.     Hernia: No hernia is present.  Musculoskeletal:        General: Normal range of motion.     Cervical back: Neck supple. No rigidity or tenderness.  Lymphadenopathy:     Cervical: No cervical adenopathy.  Skin:    General: Skin is warm and dry.  Neurological:     Mental Status: He is alert and oriented to person, place, and time.  Psychiatric:        Mood and Affect: Mood normal.        Behavior: Behavior normal.        Thought Content: Thought content normal.        Judgment: Judgment normal.    Assessment & Plan:   1. Encounter to establish care Reviewed available information and discussed health care concerns with patient.  2. Annual physical exam Checking labs. Up to date on other preventative care.  - CBC with Differential/Platelet - COMPLETE METABOLIC PANEL WITH GFR - Lipid panel  3. Acute pain of right knee Getting updated images of the right knee. Starting Meloxicam. Continue conservative measures. If no improvement in symptoms, consider following up with Dr. Karie Schwalbe for further evaluation and treatment. - DG Knee Complete 4 Views Right; Future  4. SI (sacroiliac) joint dysfunction Starting Meloxicam. Recommend stretching before and after exercises. Continue conservative measures.   Outpatient Encounter Medications as of 07/12/2020  Medication Sig  . meloxicam (MOBIC) 15 MG tablet Take 1 tablet (15 mg total) by mouth daily.  . [DISCONTINUED] fluticasone (FLONASE) 50 MCG/ACT nasal spray Place 2 sprays into both nostrils daily.   No facility-administered encounter medications on file as of 07/12/2020.    Follow-up:  Return in about 1 year (around 07/12/2021) for annual physical exam or sooner if needed.   Thayer Ohm, DNP, APRN, FNP-BC [facility]

## 2020-07-12 ENCOUNTER — Encounter: Payer: Self-pay | Admitting: Medical-Surgical

## 2020-07-12 ENCOUNTER — Ambulatory Visit: Payer: No Typology Code available for payment source | Admitting: Medical-Surgical

## 2020-07-12 ENCOUNTER — Ambulatory Visit (INDEPENDENT_AMBULATORY_CARE_PROVIDER_SITE_OTHER): Payer: No Typology Code available for payment source

## 2020-07-12 ENCOUNTER — Other Ambulatory Visit: Payer: Self-pay

## 2020-07-12 VITALS — BP 136/72 | HR 71 | Temp 98.6°F | Ht 76.0 in | Wt 313.3 lb

## 2020-07-12 DIAGNOSIS — Z7689 Persons encountering health services in other specified circumstances: Secondary | ICD-10-CM | POA: Diagnosis not present

## 2020-07-12 DIAGNOSIS — M25561 Pain in right knee: Secondary | ICD-10-CM

## 2020-07-12 DIAGNOSIS — Z1159 Encounter for screening for other viral diseases: Secondary | ICD-10-CM

## 2020-07-12 DIAGNOSIS — Z0000 Encounter for general adult medical examination without abnormal findings: Secondary | ICD-10-CM | POA: Diagnosis not present

## 2020-07-12 DIAGNOSIS — M533 Sacrococcygeal disorders, not elsewhere classified: Secondary | ICD-10-CM | POA: Diagnosis not present

## 2020-07-12 DIAGNOSIS — Z114 Encounter for screening for human immunodeficiency virus [HIV]: Secondary | ICD-10-CM

## 2020-07-12 LAB — COMPLETE METABOLIC PANEL WITH GFR
AG Ratio: 1.5 (calc) (ref 1.0–2.5)
ALT: 64 U/L — ABNORMAL HIGH (ref 9–46)
AST: 33 U/L (ref 10–40)
Albumin: 4.3 g/dL (ref 3.6–5.1)
Alkaline phosphatase (APISO): 92 U/L (ref 36–130)
BUN: 18 mg/dL (ref 7–25)
CO2: 27 mmol/L (ref 20–32)
Calcium: 9.7 mg/dL (ref 8.6–10.3)
Chloride: 106 mmol/L (ref 98–110)
Creat: 1.01 mg/dL (ref 0.60–1.35)
GFR, Est African American: 113 mL/min/{1.73_m2} (ref >=60)
GFR, Est Non African American: 97 mL/min/{1.73_m2} (ref >=60)
Globulin: 2.9 g/dL (calc) (ref 1.9–3.7)
Glucose, Bld: 88 mg/dL (ref 65–99)
Potassium: 4.8 mmol/L (ref 3.5–5.3)
Sodium: 140 mmol/L (ref 135–146)
Total Bilirubin: 0.4 mg/dL (ref 0.2–1.2)
Total Protein: 7.2 g/dL (ref 6.1–8.1)

## 2020-07-12 LAB — LIPID PANEL
Cholesterol: 166 mg/dL (ref <200)
HDL: 47 mg/dL (ref >=40)
LDL Cholesterol (Calc): 99 mg/dL (calc)
Non-HDL Cholesterol (Calc): 119 mg/dL (calc) (ref <130)
Total CHOL/HDL Ratio: 3.5 (calc) (ref <5.0)
Triglycerides: 102 mg/dL (ref <150)

## 2020-07-12 LAB — CBC WITH DIFFERENTIAL/PLATELET
Absolute Monocytes: 657 cells/uL (ref 200–950)
Basophils Absolute: 60 cells/uL (ref 0–200)
Basophils Relative: 0.9 %
Eosinophils Absolute: 101 cells/uL (ref 15–500)
Eosinophils Relative: 1.5 %
HCT: 47.4 % (ref 38.5–50.0)
Hemoglobin: 16.1 g/dL (ref 13.2–17.1)
Lymphs Abs: 1903 cells/uL (ref 850–3900)
MCH: 32.1 pg (ref 27.0–33.0)
MCHC: 34 g/dL (ref 32.0–36.0)
MCV: 94.6 fL (ref 80.0–100.0)
MPV: 11.7 fL (ref 7.5–12.5)
Monocytes Relative: 9.8 %
Neutro Abs: 3980 cells/uL (ref 1500–7800)
Neutrophils Relative %: 59.4 %
Platelets: 226 10*3/uL (ref 140–400)
RBC: 5.01 10*6/uL (ref 4.20–5.80)
RDW: 12.7 % (ref 11.0–15.0)
Total Lymphocyte: 28.4 %
WBC: 6.7 10*3/uL (ref 3.8–10.8)

## 2020-07-12 MED ORDER — MELOXICAM 15 MG PO TABS: 15.0000 mg | ORAL_TABLET | Freq: Every day | ORAL | 1 refills | Status: DC

## 2020-07-12 NOTE — Patient Instructions (Signed)
Preventive Care 21-33 Years Old, Male Preventive care refers to lifestyle choices and visits with your health care provider that can promote health and wellness. This includes:  A yearly physical exam. This is also called an annual wellness visit.  Regular dental and eye exams.  Immunizations.  Screening for certain conditions.  Healthy lifestyle choices, such as: ? Eating a healthy diet. ? Getting regular exercise. ? Not using drugs or products that contain nicotine and tobacco. ? Limiting alcohol use. What can I expect for my preventive care visit? Physical exam Your health care provider may check your:  Height and weight. These may be used to calculate your BMI (body mass index). BMI is a measurement that tells if you are at a healthy weight.  Heart rate and blood pressure.  Body temperature.  Skin for abnormal spots. Counseling Your health care provider may ask you questions about your:  Past medical problems.  Family's medical history.  Alcohol, tobacco, and drug use.  Emotional well-being.  Home life and relationship well-being.  Sexual activity.  Diet, exercise, and sleep habits.  Work and work environment.  Access to firearms. What immunizations do I need? Vaccines are usually given at various ages, according to a schedule. Your health care provider will recommend vaccines for you based on your age, medical history, and lifestyle or other factors, such as travel or where you work.   What tests do I need? Blood tests  Lipid and cholesterol levels. These may be checked every 5 years starting at age 20.  Hepatitis C test.  Hepatitis B test. Screening  Diabetes screening. This is done by checking your blood sugar (glucose) after you have not eaten for a while (fasting).  Genital exam to check for testicular cancer or hernias.  STD (sexually transmitted disease) testing, if you are at risk. Talk with your health care provider about your test results,  treatment options, and if necessary, the need for more tests.   Follow these instructions at home: Eating and drinking  Eat a healthy diet that includes fresh fruits and vegetables, whole grains, lean protein, and low-fat dairy products.  Drink enough fluid to keep your urine pale yellow.  Take vitamin and mineral supplements as recommended by your health care provider.  Do not drink alcohol if your health care provider tells you not to drink.  If you drink alcohol: ? Limit how much you have to 0-2 drinks a day. ? Be aware of how much alcohol is in your drink. In the [place] one drink equals one 12 oz bottle of beer (355 mL), one 5 oz glass of wine (148 mL), or one 1 oz glass of hard liquor (44 mL).   Lifestyle  Take daily care of your teeth and gums. Brush your teeth every morning and night with fluoride toothpaste. Floss one time each day.  Stay active. Exercise for at least 30 minutes 5 or more days each week.  Do not use any products that contain nicotine or tobacco, such as cigarettes, e-cigarettes, and chewing tobacco. If you need help quitting, ask your health care provider.  Do not use drugs.  If you are sexually active, practice safe sex. Use a condom or other form of protection to prevent STIs (sexually transmitted infections).  Find healthy ways to cope with stress, such as: ? Meditation, yoga, or listening to music. ? Journaling. ? Talking to a trusted person. ? Spending time with friends and family. Safety  Always wear your seat belt while driving   or riding in a vehicle.  Do not drive: ? If you have been drinking alcohol. Do not ride with someone who has been drinking. ? When you are tired or distracted. ? While texting.  Wear a helmet and other protective equipment during sports activities.  If you have firearms in your house, make sure you follow all gun safety procedures.  Seek help if you have been physically or sexually abused. What's next?  Go to your  health care provider once a year for an annual wellness visit.  Ask your health care provider how often you should have your eyes and teeth checked.  Stay up to date on all vaccines. This information is not intended to replace advice given to you by your health care provider. Make sure you discuss any questions you have with your health care provider. Document Revised: 12/31/2018 Document Reviewed: 04/10/2018 Elsevier Patient Education  2021 Elsevier Inc.  

## 2021-04-08 ENCOUNTER — Ambulatory Visit (HOSPITAL_COMMUNITY)
Admission: RE | Admit: 2021-04-08 | Discharge: 2021-04-08 | Disposition: A | Discharge status: Home / Self Care | Payer: 59 | Source: Ambulatory Visit | Attending: Emergency Medicine | Admitting: Emergency Medicine

## 2021-04-08 ENCOUNTER — Encounter (HOSPITAL_COMMUNITY): Payer: Self-pay

## 2021-04-08 ENCOUNTER — Ambulatory Visit (INDEPENDENT_AMBULATORY_CARE_PROVIDER_SITE_OTHER): Payer: 59

## 2021-04-08 ENCOUNTER — Other Ambulatory Visit: Payer: Self-pay

## 2021-04-08 VITALS — BP 135/73 | HR 96 | Temp 98.8°F | Resp 17

## 2021-04-08 DIAGNOSIS — R052 Subacute cough: Secondary | ICD-10-CM

## 2021-04-08 DIAGNOSIS — R059 Cough, unspecified: Secondary | ICD-10-CM

## 2021-04-08 MED ORDER — ALBUTEROL SULFATE HFA 108 (90 BASE) MCG/ACT IN AERS: 1.0000 | INHALATION_SPRAY | Freq: Four times a day (QID) | RESPIRATORY_TRACT | 0 refills | Status: DC | PRN

## 2021-04-08 NOTE — ED Provider Notes (Signed)
[facility]  ____________________________________________  Time seen: Approximately 12:31 PM  I have reviewed the triage vital signs and the nursing notes.   HISTORY  Chief Complaint appt 1200 and Cough   Historian Patient     HPI Vincent Hale is a 35 y.o. male presents to the urgent care with cough for the past 3 months.  Patient was treated with prednisone and Augmentin for sinusitis and noticed no improvement in his cough after treatment.  He states that his cough is usually worse at night.  Denies change in cough with eating.  No fever, chills or shortness of breath.  Patient denies a history of asthma.  No current chest tightness, chest pain or abdominal pain.   History reviewed. No pertinent past medical history.   Immunizations up to date:  Yes.     History reviewed. No pertinent past medical history.  Patient Active Problem List   Diagnosis Date Noted   Acute suppurative otitis media of right ear without spontaneous rupture of tympanic membrane 07/07/2018   ETD (Eustachian tube dysfunction), right 07/07/2018   Nasal congestion 07/07/2018   Fertility testing 01/10/2018   Morbid obesity (HCC) 01/10/2018   Rupture of plantar fascia of right foot 01/10/2018   Plantar fasciitis, right 01/10/2018    Past Surgical History:  Procedure Laterality Date   arthroscopic knee surgery     tear in meniscus  2009    Prior to Admission medications   Medication Sig Start Date End Date Taking? Authorizing Provider  albuterol (VENTOLIN HFA) 108 (90 Base) MCG/ACT inhaler Inhale 1-2 puffs into the lungs every 6 (six) hours as needed for wheezing or shortness of breath. 04/08/21  Yes Pia Mau M, PA-C  meloxicam (MOBIC) 15 MG tablet Take 1 tablet (15 mg total) by mouth daily. 07/12/20   Christen Butter, NP    Allergies Patient has no known allergies.  Family History  Problem Relation Age of Onset   Arthritis Mother    Heart attack Father    Hyperlipidemia  Father    Hypertension Father     Social History Social History   Tobacco Use   Smoking status: Never   Smokeless tobacco: Never  Vaping Use   Vaping Use: Never used  Substance Use Topics   Alcohol use: Yes    Comment: occasionally   Drug use: Never     Review of Systems  Constitutional: No fever/chills Eyes:  No discharge ENT: No upper respiratory complaints. Respiratory: Patient has cough.  Gastrointestinal:   No nausea, no vomiting.  No diarrhea.  No constipation. Musculoskeletal: Negative for musculoskeletal pain. Skin: Negative for rash, abrasions, lacerations, ecchymosis.    ____________________________________________   PHYSICAL EXAM:  VITAL SIGNS: ED Triage Vitals [04/08/21 1207]  Enc Vitals Group     BP 135/73     Pulse Rate 96     Resp 17     Temp 98.8 F (37.1 C)     Temp Source Oral     SpO2 97 %     Weight      Height      Head Circumference      Peak Flow      Pain Score 0     Pain Loc      Pain Edu?      Excl. in GC?      Constitutional: Alert and oriented. Well appearing and in no acute distress. Eyes: Conjunctivae are normal. PERRL. EOMI. Head: Atraumatic. ENT:  Nose: No congestion/rhinnorhea.      Mouth/Throat: Mucous membranes are moist.  Neck: No stridor.  No cervical spine tenderness to palpation. Cardiovascular: Normal rate, regular rhythm. Normal S1 and S2.  Good peripheral circulation. Respiratory: Normal respiratory effort without tachypnea or retractions. Patient has mild expiratory wheeze. Good air entry to the bases with no decreased or absent breath sounds Gastrointestinal: Bowel sounds x 4 quadrants. Soft and nontender to palpation. No guarding or rigidity. No distention. Musculoskeletal: Full range of motion to all extremities. No obvious deformities noted Neurologic:  Normal for age. No gross focal neurologic deficits are appreciated.  Skin:  Skin is warm, dry and intact. No rash noted. Psychiatric: Mood and  affect are normal for age. Speech and behavior are normal.   ____________________________________________   LABS (all labs ordered are listed, but only abnormal results are displayed)  Labs Reviewed - No data to display ____________________________________________  EKG   ____________________________________________  RADIOLOGY   DG Chest 2 View  Result Date: 04/08/2021 CLINICAL DATA:  cough for 3 monthsCough for 3 months EXAM: CHEST - 2 VIEW COMPARISON:  None. FINDINGS: Normal mediastinum and cardiac silhouette. Normal pulmonary vasculature. No evidence of effusion, infiltrate, or pneumothorax. No acute bony abnormality. IMPRESSION: No acute cardiopulmonary process. Electronically Signed   By: Genevive Bi M.D.   On: 04/08/2021 12:53    ____________________________________________    PROCEDURES  Procedure(s) performed:     Procedures     Medications - No data to display   ____________________________________________   INITIAL IMPRESSION / ASSESSMENT AND PLAN / ED COURSE  Pertinent labs & imaging results that were available during my care of the patient were reviewed by me and considered in my medical decision making (see chart for details).      Assessment and Plan: Cough:  Chest x-ray shows no consolidations, opacities, infiltrates, pneumothorax or signs of malignancy.  We will treat with Pepcid and Zyrtec and will prescribe patient an albuterol inhaler.  Return precautions were given to return with new or worsening symptoms.     ____________________________________________  FINAL CLINICAL IMPRESSION(S) / ED DIAGNOSES  Final diagnoses:  Subacute cough      NEW MEDICATIONS STARTED DURING THIS VISIT:  ED Discharge Orders          Ordered    albuterol (VENTOLIN HFA) 108 (90 Base) MCG/ACT inhaler  Every 6 hours PRN        04/08/21 1310                This chart was dictated using voice recognition software/Dragon. Despite best  efforts to proofread, errors can occur which can change the meaning. Any change was purely unintentional.     Orvil Feil, PA-C 04/08/21 1317

## 2021-04-08 NOTE — ED Triage Notes (Signed)
Pt had cough for 3 months. Had different appts in this time and received medicines for cough that helps for short period of time. Cough is worse at night time.

## 2021-04-08 NOTE — Discharge Instructions (Addendum)
Take 40 mg of Pepcid once daily. Take 10 mg of Zyrtec once daily.  Take 2 puffs of Albuterol every four hours for wheezing.

## 2021-05-03 ENCOUNTER — Other Ambulatory Visit: Payer: Self-pay | Admitting: Medical-Surgical

## 2021-05-10 ENCOUNTER — Other Ambulatory Visit: Payer: Self-pay

## 2021-05-10 ENCOUNTER — Encounter (HOSPITAL_COMMUNITY): Payer: Self-pay

## 2021-05-10 ENCOUNTER — Emergency Department (HOSPITAL_COMMUNITY): Payer: 59

## 2021-05-10 ENCOUNTER — Emergency Department (HOSPITAL_COMMUNITY)
Admission: EM | Admit: 2021-05-10 | Discharge: 2021-05-10 | Disposition: A | Discharge status: Home / Self Care | Payer: 59 | Attending: Emergency Medicine | Admitting: Emergency Medicine

## 2021-05-10 DIAGNOSIS — J45901 Unspecified asthma with (acute) exacerbation: Secondary | ICD-10-CM | POA: Insufficient documentation

## 2021-05-10 DIAGNOSIS — R0602 Shortness of breath: Secondary | ICD-10-CM | POA: Diagnosis present

## 2021-05-10 DIAGNOSIS — J4521 Mild intermittent asthma with (acute) exacerbation: Secondary | ICD-10-CM

## 2021-05-10 MED ORDER — DEXAMETHASONE SODIUM PHOSPHATE 10 MG/ML IJ SOLN
10.0000 mg | Freq: Once | INTRAMUSCULAR | Status: AC
  Administered 2021-05-10: 10 mg via INTRAMUSCULAR
  Filled 2021-05-10: qty 1

## 2021-05-10 MED ORDER — ALBUTEROL SULFATE (2.5 MG/3ML) 0.083% IN NEBU
2.5000 mg | INHALATION_SOLUTION | Freq: Once | RESPIRATORY_TRACT | Status: AC
  Administered 2021-05-10: 2.5 mg via RESPIRATORY_TRACT
  Filled 2021-05-10: qty 3

## 2021-05-10 NOTE — ED Triage Notes (Signed)
Pt arrives POV for eval of SOB. Pt reports that he has been SOB x mos, w/ a chronic cough-is being eval'd by several physicians for same w/ no answers at present. Pt reports SOB woke him from sleep tonight and caused him to present here.

## 2021-05-10 NOTE — ED Provider Triage Note (Signed)
Emergency Medicine Provider Triage Evaluation Note  Vincent Hale , a 36 y.o. male  was evaluated in triage.  Pt complains of shortness of breath and wheezing.  Patient states that he was treated for sinus infection about 4 months ago and since then has been dealing with an intermittent productive cough as well as intermittent shortness of breath and wheezing.  He states that in the middle the night he woke up from sleep coughing and became short of breath so he came to the emergency department.  Denies any chest pain.  States he has been evaluated for this multiple times as well as most recently by ENT in December.  He has a follow-up with pulmonology later this month.  Denies a history of smoking or asthma.  Physical Exam  BP 106/84 (BP Location: Right Arm)    Pulse 86    Temp 98.3 F (36.8 C) (Oral)    Resp (!) 25    Ht 6\' 4"  (1.93 m)    Wt (!) 145.2 kg    SpO2 95%    BMI 38.95 kg/m  Gen:   Awake, no distress   Resp:  Normal effort  MSK:   Moves extremities without difficulty  Other:  Expiratory wheezes noted in all lung fields.  Medical Decision Making  Medically screening exam initiated at 3:05 AM.  Appropriate orders placed.  Vincent Hale was informed that the remainder of the evaluation will be completed by another provider, this initial triage assessment does not replace that evaluation, and the importance of remaining in the ED until their evaluation is complete.   Rowe Robert, PA-C 05/10/21 [phone]

## 2021-05-10 NOTE — ED Provider Notes (Signed)
[facility] [place] [facility]PART[facility] Provider Note   CSN: [id number] Arrival date & time: 05/10/21  0239     History  Chief Complaint  Patient presents with   Shortness of Breath    Vincent Hale is a 36 y.o. male with no significant PMHx presents with chronic cough for the past 3 months. Pt endorse associated SOB.  Patient states that in October 2022 he was diagnosed with sinusitis and was given Augmentin and prednisone at that time.  Since then patient's been experiencing a dry cough.  He endorses the coughing is worse at night with associated shortness of breath.  He was seen by his primary care physician in December 2022 for similar symptoms and was prescribed albuterol inhaler, Pepcid, Zyrtec, montelukast.  He endorsed moderate relief with albuterol inhaler.  He states he uses his inhaler up to 3-5 times per day.  He states that the coughing and shortness of breath is worse at night requiring to use his inhaler at least 2 more times.  He endorsed that he was also prescribed montelukast which he takes daily, with partial relief.  Patient denies lightheadedness, dizziness, fever, chills, chest pain, abdominal pain, or syncopal episodes.    Shortness of Breath Associated symptoms: cough and wheezing   Associated symptoms: no chest pain, no fever, no headaches, no sore throat and no vomiting       Home Medications Prior to Admission medications   Medication Sig Start Date End Date Taking? Authorizing Provider  albuterol (VENTOLIN HFA) 108 (90 Base) MCG/ACT inhaler INHALE 1-2 PUFFS BY MOUTH EVERY 6 HOURS AS NEEDED FOR WHEEZE OR SHORTNESS OF BREATH Patient taking differently: Inhale 1-2 puffs into the lungs every 6 (six) hours as needed for shortness of breath or wheezing. 05/03/21  Yes Samuel Bouche, NP  cetirizine (ZYRTEC) 10 MG tablet Take 10 mg by mouth daily.   Yes [provider]  montelukast (SINGULAIR) 10 MG tablet Take 10 mg by mouth daily. 04/26/21  Yes  [provider]  meloxicam (MOBIC) 15 MG tablet Take 1 tablet (15 mg total) by mouth daily. Patient not taking: Reported on 05/10/2021 07/12/20   Samuel Bouche, NP      Allergies    Patient has no known allergies.    Review of Systems   Review of Systems  Constitutional:  Negative for chills and fever.  HENT:  Negative for sore throat.   Eyes:  Negative for visual disturbance.  Respiratory:  Positive for cough, shortness of breath and wheezing.   Cardiovascular:  Negative for chest pain and palpitations.  Gastrointestinal:  Negative for nausea and vomiting.  Musculoskeletal:  Negative for myalgias.  Neurological:  Negative for dizziness, syncope, light-headedness and headaches.   Physical Exam Updated Vital Signs BP 140/80    Pulse 70    Temp 98 F (36.7 C) (Oral)    Resp 19    Ht 6\' 4"  (1.93 m)    Wt (!) 145.2 kg    SpO2 95%    BMI 38.95 kg/m  Physical Exam Constitutional:      General: He is not in acute distress. HENT:     Head: Normocephalic and atraumatic.  Cardiovascular:     Rate and Rhythm: Normal rate and regular rhythm.     Heart sounds: Normal heart sounds.  Pulmonary:     Effort: Pulmonary effort is normal.     Breath sounds: Normal air entry. Wheezing present.  Abdominal:     General: Abdomen is protuberant.  Palpations: Abdomen is soft.  Musculoskeletal:     Right lower leg: No edema.     Left lower leg: No edema.  Skin:    General: Skin is warm and dry.  Neurological:     General: No focal deficit present.     Mental Status: He is alert.  Psychiatric:        Mood and Affect: Mood normal.        Behavior: Behavior normal. Behavior is cooperative.    ED Results / Procedures / Treatments   Labs (all labs ordered are listed, but only abnormal results are displayed) Labs Reviewed - No data to display  EKG EKG Interpretation  Date/Time:  Wednesday May 10 2021 03:26:01 EST Ventricular Rate:  88 PR Interval:  172 QRS Duration: 82 QT  Interval:  362 QTC Calculation: 438 R Axis:   47 Text Interpretation: Normal sinus rhythm with sinus arrhythmia Normal ECG No previous ECGs available Confirmed by Elnora Morrison [phone]) on 05/10/2021 9:33:32 AM  Radiology DG Chest 2 View  Result Date: 05/10/2021 CLINICAL DATA:  Shortness of breath, wheezing EXAM: CHEST - 2 VIEW COMPARISON:  04/08/2021 FINDINGS: Heart and mediastinal contours are within normal limits. No focal opacities or effusions. No acute bony abnormality. IMPRESSION: No active cardiopulmonary disease. Electronically Signed   By: Rolm Baptise M.D.   On: 05/10/2021 03:55    Procedures Procedures    Medications Ordered in ED Medications  dexamethasone (DECADRON) injection 10 mg (has no administration in time range)  albuterol (PROVENTIL) (2.5 MG/3ML) 0.083% nebulizer solution 2.5 mg (has no administration in time range)    ED Course/ Medical Decision Making/ A&P                           Medical Decision Making  Acute Asthma Exacerbation Pt presents with chronic cough of 3 months duration with associated SOB. Symptoms worse at night. Pt denies sputum production or smoking history. Pt currently takes montelukast and albuterol inhaler. On exam, pt appear well, no acute distress, wheezing appreciated throughout bilateral lung fields on ascultation. Pt demonstrated use of inhaler, which he is using properly. Low suspicion for PE, as pt is saturating well on RA, no new O2 requirement. EKG was reassuring. Pt has no significant PMH, low suspicion for cardiac etiology. As pt denies PND or CP. Heart sounds normal on exam and no associated LEE noted. CXR negative for active cardiopulmonary disease. Pt denies fever or chills. Pt denies sick contacts. Infectious process considered, however, no sign of infection noted. Pt given one IM dose of decadron and albuterol nebulizer treatment.  Following the breathing treatment, patient endorsed significant improvement in his breathing.  Denies  shortness of breath.  Lung sounds improved, significantly less wheezing noted on auscultation.  Patient is stable for discharge.         Final Clinical Impression(s) / ED Diagnoses Final diagnoses:  None    Rx / DC Orders ED Discharge Orders     None         Timothy Lasso, MD 05/10/21 1122    Elnora Morrison, MD 05/12/21 1301

## 2021-05-10 NOTE — Discharge Instructions (Signed)
Please follow up with pulmonologist and your primary care doctor.

## 2021-05-26 ENCOUNTER — Encounter: Payer: Self-pay | Admitting: Pulmonary Disease

## 2021-05-26 ENCOUNTER — Ambulatory Visit (INDEPENDENT_AMBULATORY_CARE_PROVIDER_SITE_OTHER): Payer: 59 | Admitting: Pulmonary Disease

## 2021-05-26 ENCOUNTER — Other Ambulatory Visit: Payer: Self-pay

## 2021-05-26 VITALS — BP 126/68 | HR 92 | Temp 98.3°F | Ht 75.0 in | Wt 323.8 lb

## 2021-05-26 DIAGNOSIS — J4541 Moderate persistent asthma with (acute) exacerbation: Secondary | ICD-10-CM | POA: Insufficient documentation

## 2021-05-26 DIAGNOSIS — R059 Cough, unspecified: Secondary | ICD-10-CM

## 2021-05-26 MED ORDER — BUDESONIDE-FORMOTEROL FUMARATE 160-4.5 MCG/ACT IN AERO: 2.0000 | INHALATION_SPRAY | Freq: Two times a day (BID) | RESPIRATORY_TRACT | 5 refills | Status: DC

## 2021-05-26 MED ORDER — PREDNISONE 20 MG PO TABS: ORAL_TABLET | ORAL | 0 refills | Status: DC

## 2021-05-26 NOTE — Patient Instructions (Signed)
We will start you on an inhaler called Symbicort 160 Check CBC differential, IgE Prednisone 40 mg a day for 5 days Schedule PFTs and follow-up in 3 months

## 2021-05-26 NOTE — Progress Notes (Signed)
Vincent Hale    [id number]    [birth year]  Primary Care Physician:Jessup, Caryl Asp, NP  Referring Physician: Samuel Bouche, NP [address] [place],  [place] [address]  Chief complaint:   Consult for chronic cough, dyspnea  HPI: 36 year old with seasonal allergies Developed sinus infection and nasal congestion around October 2022 Treated with Singulair, Pepcid, Zyrtec and albuterol inhaler.  He continues to have cough, wheezing, dyspnea.  He saw ENT in December 2022 with normal examination. He had a ED visit in December and January for persistent dyspnea, wheezing Given steroids and nebulizer  Pets: Dog Occupation: Works for [employer], short-term disability department Exposures: No mold, hot tub, Jacuzzi.  No feather pillows or comforter Smoking history: Never smoker Travel history: No significant travel history Relevant family history: No family history of lung disease  Outpatient Encounter Medications as of 05/26/2021  Medication Sig   albuterol (VENTOLIN HFA) 108 (90 Base) MCG/ACT inhaler INHALE 1-2 PUFFS BY MOUTH EVERY 6 HOURS AS NEEDED FOR WHEEZE OR SHORTNESS OF BREATH (Patient taking differently: Inhale 1-2 puffs into the lungs every 6 (six) hours as needed for shortness of breath or wheezing.)   cetirizine (ZYRTEC) 10 MG tablet Take 10 mg by mouth daily.   montelukast (SINGULAIR) 10 MG tablet Take 10 mg by mouth daily.   [DISCONTINUED] meloxicam (MOBIC) 15 MG tablet Take 1 tablet (15 mg total) by mouth daily. (Patient not taking: Reported on 05/10/2021)   No facility-administered encounter medications on file as of 05/26/2021.    Allergies as of 05/26/2021   (No Known Allergies)    No past medical history on file.  Past Surgical History:  Procedure Laterality Date   arthroscopic knee surgery     tear in meniscus  2009    Family History  Problem Relation Age of Onset   Arthritis Mother    Heart attack Father    Hyperlipidemia Father     Hypertension Father     Social History   Socioeconomic History   Marital status: Married    Spouse name: Not on file   Number of children: Not on file   Years of education: Not on file   Highest education level: Not on file  Occupational History   Not on file  Tobacco Use   Smoking status: Never   Smokeless tobacco: Never  Vaping Use   Vaping Use: Never used  Substance and Sexual Activity   Alcohol use: Yes    Comment: occasionally   Drug use: Never   Sexual activity: Yes    Birth control/protection: None  Other Topics Concern   Not on file  Social History Narrative   Not on file   Social Determinants of Health   Financial Resource Strain: Not on file  Food Insecurity: Not on file  Transportation Needs: Not on file  Physical Activity: Not on file  Stress: Not on file  Social Connections: Not on file  Intimate Partner Violence: Not on file    Review of systems: Review of Systems  Constitutional: Negative for fever and chills.  HENT: Negative.   Eyes: Negative for blurred vision.  Respiratory: as per HPI  Cardiovascular: Negative for chest pain and palpitations.  Gastrointestinal: Negative for vomiting, diarrhea, blood per rectum. Genitourinary: Negative for dysuria, urgency, frequency and hematuria.  Musculoskeletal: Negative for myalgias, back pain and joint pain.  Skin: Negative for itching and rash.  Neurological: Negative for dizziness, tremors, focal  weakness, seizures and loss of consciousness.  Endo/Heme/Allergies: Negative for environmental allergies.  Psychiatric/Behavioral: Negative for depression, suicidal ideas and hallucinations.  All other systems reviewed and are negative.  Physical Exam: Blood pressure 126/68, pulse 92, temperature 98.3 F (36.8 C), temperature source Oral, height 6\' 3"  (1.905 m), weight (!) 323 lb 12.8 oz (146.9 kg), SpO2 95 %. Gen:      No acute distress HEENT:  EOMI, sclera anicteric Neck:     No masses; no  thyromegaly Lungs:    Clear to auscultation bilaterally; normal respiratory effort CV:         Regular rate and rhythm; no murmurs Abd:      + bowel sounds; soft, non-tender; no palpable masses, no distension Ext:    No edema; adequate peripheral perfusion Skin:      Warm and dry; no rash Neuro: alert and oriented x 3 Psych: normal mood and affect  Data Reviewed: Imaging: Chest x-ray 05/06/2021-no active cardiopulmonary disease. I have reviewed the images personally.  PFTs:  ACT score 05/26/2021- 9  Labs: CBC 07/12/2020-WBC 6.7, eos 1.5%, absolute eosinophil count 100.5  Assessment:  Acute asthma exacerbation History of seasonal allergies He continues to be symptomatic with wheezing on examination.  Recent chest x-ray no acute cardiopulmonary abnormalities Continue Singulair, albuterol inhaler Start Symbicort, prednisone 40 mg a day for 5 days Check CBC differential, IgE Schedule PFTs  Plan/Recommendations: Singulair, albuterol Start Symbicort, prednisone 40 mg a day for 5 days CBC, IgE PFTs  Marshell Garfinkel MD [facility] 05/26/2021, 3:48 PM  CC: Samuel Bouche, NP

## 2021-05-29 ENCOUNTER — Encounter: Payer: Self-pay | Admitting: Pulmonary Disease

## 2021-05-29 LAB — CBC WITH DIFFERENTIAL/PLATELET
Absolute Monocytes: 800 cells/uL (ref 200–950)
Basophils Absolute: 98 cells/uL (ref 0–200)
Basophils Relative: 0.8 %
Eosinophils Absolute: 2005 cells/uL — ABNORMAL HIGH (ref 15–500)
Eosinophils Relative: 16.3 %
HCT: 45.3 % (ref 38.5–50.0)
Hemoglobin: 15.5 g/dL (ref 13.2–17.1)
Lymphs Abs: 2522 cells/uL (ref 850–3900)
MCH: 32.2 pg (ref 27.0–33.0)
MCHC: 34.2 g/dL (ref 32.0–36.0)
MCV: 94.2 fL (ref 80.0–100.0)
MPV: 11.8 fL (ref 7.5–12.5)
Monocytes Relative: 6.5 %
Neutro Abs: 6876 cells/uL (ref 1500–7800)
Neutrophils Relative %: 55.9 %
Platelets: 239 10*3/uL (ref 140–400)
RBC: 4.81 10*6/uL (ref 4.20–5.80)
RDW: 12.8 % (ref 11.0–15.0)
Total Lymphocyte: 20.5 %
WBC: 12.3 10*3/uL — ABNORMAL HIGH (ref 3.8–10.8)

## 2021-05-29 LAB — IGE: IgE (Immunoglobulin E), Serum: 127 kU/L — ABNORMAL HIGH (ref <=114)

## 2021-05-30 NOTE — Telephone Encounter (Signed)
See results note. Pt was informed of lab results.

## 2021-06-13 ENCOUNTER — Other Ambulatory Visit: Payer: Self-pay | Admitting: Medical-Surgical

## 2021-08-24 ENCOUNTER — Ambulatory Visit: Payer: 59 | Admitting: Pulmonary Disease

## 2021-09-27 ENCOUNTER — Telehealth: Payer: 59 | Admitting: Nurse Practitioner

## 2022-01-22 ENCOUNTER — Other Ambulatory Visit: Payer: Self-pay | Admitting: Medical-Surgical

## 2022-01-22 ENCOUNTER — Other Ambulatory Visit: Payer: Self-pay | Admitting: Pulmonary Disease

## 2022-01-24 NOTE — Telephone Encounter (Signed)
Patient has not been seen in our office since March 2022.  He will need an appointment for further refills on medications.

## 2022-01-25 NOTE — Telephone Encounter (Signed)
Lvm for patient to call back and schedule an appointment for further med refills- tvt

## 2022-04-08 ENCOUNTER — Ambulatory Visit (HOSPITAL_COMMUNITY)
Admission: EM | Admit: 2022-04-08 | Discharge: 2022-04-08 | Disposition: A | Discharge status: Home / Self Care | Payer: BC Managed Care – PPO | Attending: Emergency Medicine | Admitting: Emergency Medicine

## 2022-04-08 ENCOUNTER — Encounter (HOSPITAL_COMMUNITY): Payer: Self-pay

## 2022-04-08 DIAGNOSIS — R519 Headache, unspecified: Secondary | ICD-10-CM | POA: Diagnosis not present

## 2022-04-08 DIAGNOSIS — J069 Acute upper respiratory infection, unspecified: Secondary | ICD-10-CM | POA: Diagnosis not present

## 2022-04-08 LAB — POCT RAPID STREP A, ED / UC: Streptococcus, Group A Screen (Direct): NEGATIVE

## 2022-04-08 MED ORDER — KETOROLAC TROMETHAMINE 30 MG/ML IJ SOLN
INTRAMUSCULAR | Status: AC
  Filled 2022-04-08: qty 1

## 2022-04-08 MED ORDER — KETOROLAC TROMETHAMINE 30 MG/ML IJ SOLN
30.0000 mg | Freq: Once | INTRAMUSCULAR | Status: AC
  Administered 2022-04-08: 30 mg via INTRAMUSCULAR

## 2022-04-08 MED ORDER — AMOXICILLIN-POT CLAVULANATE 875-125 MG PO TABS: 1.0000 | ORAL_TABLET | Freq: Two times a day (BID) | ORAL | 0 refills | Status: DC

## 2022-04-08 NOTE — ED Provider Notes (Signed)
[facility]    CSN: [id number] Arrival date & time: 04/08/22  1006      History   Chief Complaint Chief Complaint  Patient presents with   URI   Fever    HPI Vincent Hale is a 36 y.o. male.   Patient presents for evaluation of fever, chills, body aches, nasal congestion, rhinorrhea, and mildly productive cough and constant generalized headaches for 7 days.  No known sick contacts.  Decreased appetite but tolerating fluids.  Has attempted use of Excedrin, ibuprofen, DayQuil and NyQuil which have been minimally effective.  History of asthma, recurrent sinusitis.  Followed by ENT.  Recently had sinus infection 1 month ago, treated with 2 weeks of Augmentin, endorses symptoms resolved at that time.    History reviewed. No pertinent past medical history.  Patient Active Problem List   Diagnosis Date Noted   Moderate persistent asthma with acute exacerbation 05/26/2021   Acute suppurative otitis media of right ear without spontaneous rupture of tympanic membrane 07/07/2018   ETD (Eustachian tube dysfunction), right 07/07/2018   Nasal congestion 07/07/2018   Fertility testing 01/10/2018   Morbid obesity (HCC) 01/10/2018   Rupture of plantar fascia of right foot 01/10/2018   Plantar fasciitis, right 01/10/2018    Past Surgical History:  Procedure Laterality Date   arthroscopic knee surgery     tear in meniscus  2009       Home Medications    Prior to Admission medications   Medication Sig Start Date End Date Taking? Authorizing Provider  albuterol (VENTOLIN HFA) 108 (90 Base) MCG/ACT inhaler INHALE 1-2 PUFFS BY MOUTH EVERY 6 HOURS AS NEEDED FOR WHEEZE OR SHORTNESS OF BREATH 06/13/21   Christen Butter, NP  budesonide-formoterol (SYMBICORT) 160-4.5 MCG/ACT inhaler Inhale 2 puffs into the lungs in the morning and at bedtime. 05/26/21   Mannam, Colbert Coyer, MD  cetirizine (ZYRTEC) 10 MG tablet Take 10 mg by mouth daily.    [provider]  montelukast  (SINGULAIR) 10 MG tablet Take 10 mg by mouth daily. 04/26/21   [provider]  predniSONE (DELTASONE) 20 MG tablet Take 40mg  for 5 days 05/26/21   05/28/21, MD    Family History Family History  Problem Relation Age of Onset   Arthritis Mother    Heart attack Father    Hyperlipidemia Father    Hypertension Father     Social History Social History   Tobacco Use   Smoking status: Never   Smokeless tobacco: Never  Vaping Use   Vaping Use: Never used  Substance Use Topics   Alcohol use: Yes    Comment: occasionally   Drug use: Never     Allergies   Patient has no known allergies.   Review of Systems Review of Systems  Constitutional:  Positive for chills and fever. Negative for activity change, appetite change, diaphoresis, fatigue and unexpected weight change.  HENT:  Positive for congestion and rhinorrhea. Negative for dental problem, drooling, ear discharge, ear pain, facial swelling, hearing loss, mouth sores, nosebleeds, postnasal drip, sinus pressure, sinus pain, sneezing, sore throat, tinnitus, trouble swallowing and voice change.   Respiratory:  Positive for cough. Negative for apnea, choking, chest tightness, shortness of breath, wheezing and stridor.   Cardiovascular: Negative.   Gastrointestinal: Negative.   Musculoskeletal:  Positive for myalgias. Negative for arthralgias, back pain, gait problem, joint swelling, neck pain and neck stiffness.  Skin: Negative.   Neurological:  Positive for headaches. Negative for dizziness, tremors, seizures, syncope,  facial asymmetry, speech difficulty, weakness, light-headedness and numbness.     Physical Exam Triage Vital Signs ED Triage Vitals [04/08/22 1056]  Enc Vitals Group     BP 136/82     Pulse Rate (!) 102     Resp 16     Temp 99.9 F (37.7 C)     Temp Source Oral     SpO2 94 %     Weight      Height      Head Circumference      Peak Flow      Pain Score 2     Pain Loc      Pain Edu?       Excl. in GC?    No data found.  Updated Vital Signs BP 136/82 (BP Location: Left Arm)   Pulse (!) 102   Temp 99.9 F (37.7 C) (Oral)   Resp 16   SpO2 94%   Visual Acuity Right Eye Distance:   Left Eye Distance:   Bilateral Distance:    Right Eye Near:   Left Eye Near:    Bilateral Near:     Physical Exam Constitutional:      Appearance: Normal appearance.  HENT:     Head: Normocephalic.     Right Ear: Tympanic membrane, ear canal and external ear normal.     Left Ear: Tympanic membrane, ear canal and external ear normal.     Nose: Congestion and rhinorrhea present.     Right Turbinates: Swollen.     Left Turbinates: Swollen.     Right Sinus: Frontal sinus tenderness present. No maxillary sinus tenderness.     Left Sinus: No maxillary sinus tenderness or frontal sinus tenderness.     Mouth/Throat:     Mouth: Mucous membranes are moist.     Pharynx: Posterior oropharyngeal erythema present. No oropharyngeal exudate.     Tonsils: No tonsillar exudate. 2+ on the right. 2+ on the left.  Cardiovascular:     Rate and Rhythm: Normal rate and regular rhythm.     Pulses: Normal pulses.     Heart sounds: Normal heart sounds.  Pulmonary:     Effort: Pulmonary effort is normal.     Breath sounds: Normal breath sounds.  Musculoskeletal:     Cervical back: Normal range of motion and neck supple.  Skin:    General: Skin is warm and dry.  Neurological:     Mental Status: He is alert and oriented to person, place, and time. Mental status is at baseline.  Psychiatric:        Mood and Affect: Mood normal.        Behavior: Behavior normal.      UC Treatments / Results  Labs (all labs ordered are listed, but only abnormal results are displayed) Labs Reviewed - No data to display  EKG   Radiology No results found.  Procedures Procedures (including critical care time)  Medications Ordered in UC Medications - No data to display  Initial Impression / Assessment and  Plan / UC Course  I have reviewed the triage vital signs and the nursing notes.  Pertinent labs & imaging results that were available during my care of the patient were reviewed by me and considered in my medical decision making (see chart for details).  Acute URI  Patient is in no signs of distress nor toxic appearing.  Vital signs are stable.  Low suspicion for pneumonia, pneumothorax or bronchitis and therefore will defer imaging.  Rapid strep test negative, completed due to erythema or and tonsillar adenopathy, discussed findings with patient, Toradol injection given in office for management of headache.  Augmentin prescribed.May use additional over-the-counter medications as needed for supportive care.  May follow-up with urgent care as needed if symptoms persist or worsen.   Final Clinical Impressions(s) / UC Diagnoses   Final diagnoses:  None   Discharge Instructions   None    ED Prescriptions   None    PDMP not reviewed this encounter.   Valinda Hoar, NP 04/08/22 1124

## 2022-04-08 NOTE — ED Triage Notes (Signed)
Chief Complaint: Patient having fever (102), chills, body aches, headache for 5 days straight, runny nose. Was on antibiotics 2 weeks ago and symptoms started after this med was completed.   Onset: 1 week ago  Prescriptions or OTC medications tried: Yes- Excedrin migraine; ibuprofen; day and night quil    with little relief  Sick exposure: No  New foods, medications, or products: No  Recent Travel: No

## 2022-04-08 NOTE — Discharge Instructions (Addendum)
Today you are being treated for upper respiratory infection, based on your history of reoccurring sinusitis we will prophylactically start you on antibiotic to keep symptoms from worsening  Begin Augmentin every morning and every evening for 7 days, ideally you begin to see improvement in about 48 hours and steady progression from there  You have been given an injection of Toradol here today in the office, this medicine helps to reduce inflammation and irritation, ideally will help to toenail your headache within the next 30 minutes so that oral medicine is more effective  You may continue over-the-counter medications as deemed helpful for additional symptom management  You can take Tylenol and/or Ibuprofen as needed for fever reduction and pain relief.   For cough: honey 1/2 to 1 teaspoon (you can dilute the honey in water or another fluid).  You can also use guaifenesin and dextromethorphan for cough. You can use a humidifier for chest congestion and cough.  If you don't have a humidifier, you can sit in the bathroom with the hot shower running.      For sore throat: try warm salt water gargles, cepacol lozenges, throat spray, warm tea or water with lemon/honey, popsicles or ice, or OTC cold relief medicine for throat discomfort.   For congestion: take a daily anti-histamine like Zyrtec, Claritin, and a oral decongestant, such as pseudoephedrine.  You can also use Flonase 1-2 sprays in each nostril daily.   It is important to stay hydrated: drink plenty of fluids (water, gatorade/powerade/pedialyte, juices, or teas) to keep your throat moisturized and help further relieve irritation/discomfort.

## 2022-04-18 ENCOUNTER — Ambulatory Visit (INDEPENDENT_AMBULATORY_CARE_PROVIDER_SITE_OTHER): Payer: BC Managed Care – PPO | Admitting: Family Medicine

## 2022-04-18 ENCOUNTER — Encounter: Payer: Self-pay | Admitting: Family Medicine

## 2022-04-18 ENCOUNTER — Ambulatory Visit (INDEPENDENT_AMBULATORY_CARE_PROVIDER_SITE_OTHER): Payer: BC Managed Care – PPO

## 2022-04-18 VITALS — BP 113/69 | HR 100 | Temp 98.5°F | Ht 75.0 in | Wt 320.9 lb

## 2022-04-18 DIAGNOSIS — I10 Essential (primary) hypertension: Secondary | ICD-10-CM | POA: Diagnosis not present

## 2022-04-18 DIAGNOSIS — J40 Bronchitis, not specified as acute or chronic: Secondary | ICD-10-CM | POA: Diagnosis not present

## 2022-04-18 DIAGNOSIS — R6889 Other general symptoms and signs: Secondary | ICD-10-CM | POA: Diagnosis not present

## 2022-04-18 MED ORDER — METHYLPREDNISOLONE 4 MG PO TBPK: ORAL_TABLET | ORAL | 0 refills | Status: DC

## 2022-04-18 NOTE — Progress Notes (Signed)
Acute Office Visit  Subjective:     Patient ID: Vincent Hale, male    DOB: 09-30-1985, 36 y.o.   MRN: [id number]  Chief Complaint  Patient presents with   URI    X 3 weeks. On 3rd course of amoxicillin. Patient is also alternating with Delsym/Nyquil and Excedrin as needed. Tested negative recently for Covid.   Cough    Productive, yellowish/green mucus.     URI  Associated symptoms include coughing. Pertinent negatives include no chest pain, congestion, sinus pain or wheezing.  Cough Associated symptoms include a fever. Pertinent negatives include no chest pain, shortness of breath or wheezing.   Patient is in today for acute visit.  Pt reports 3 weeks of chills and fever of 101 body temp under the cover. Has had dry cough for the last few days, fatigue and malaise. Also having diarrhea for the last night along with headaches. No sore throat, no SOB, no chest pains. He has tried Amoxicillin that's not helped. He's had 5 rounds of Amoxicillin for the last 4 weeks from ENT and urgent care. He's been established with ENT for the past 5 months.  He also was prescribed Albuterol to use prn along with symbicort in Jan 2023 from pulmonologist. He hasn't been using these consistently.  No past medical history on file.  Review of Systems  Constitutional:  Positive for fever. Negative for malaise/fatigue.  HENT:  Negative for congestion and sinus pain.   Respiratory:  Positive for cough and sputum production. Negative for shortness of breath and wheezing.   Cardiovascular:  Negative for chest pain.  All other systems reviewed and are negative.       Objective:    BP 113/69 (BP Location: Left Arm, Patient Position: Sitting, Cuff Size: Normal)   Pulse 100   Temp 98.5 F (36.9 C) (Oral)   Ht 6\' 3"  (1.905 m)   Wt (!) 320 lb 14.4 oz (145.6 kg)   SpO2 95%   BMI 40.11 kg/m    Physical Exam Vitals and nursing note reviewed.  Constitutional:      Appearance: Normal appearance.  He is obese.  HENT:     Head: Normocephalic and atraumatic.     Right Ear: Tympanic membrane, ear canal and external ear normal.     Left Ear: Tympanic membrane, ear canal and external ear normal.     Nose: Nose normal.     Mouth/Throat:     Mouth: Mucous membranes are moist.     Pharynx: Oropharynx is clear.  Eyes:     Extraocular Movements: Extraocular movements intact.     Conjunctiva/sclera: Conjunctivae normal.     Pupils: Pupils are equal, round, and reactive to light.  Cardiovascular:     Rate and Rhythm: Normal rate and regular rhythm.     Pulses: Normal pulses.     Heart sounds: Normal heart sounds.  Pulmonary:     Effort: Pulmonary effort is normal.     Breath sounds: Normal breath sounds.  Abdominal:     General: Abdomen is flat. Bowel sounds are normal.  Skin:    General: Skin is warm.     Capillary Refill: Capillary refill takes less than 2 seconds.  Neurological:     General: No focal deficit present.     Mental Status: He is alert and oriented to person, place, and time. Mental status is at baseline.  Psychiatric:        Mood and Affect: Mood normal.  Behavior: Behavior normal.        Thought Content: Thought content normal.        Judgment: Judgment normal.     No results found for any visits on 04/18/22.      Assessment & Plan:   Problem List Items Addressed This Visit   None Visit Diagnoses     Flu-like symptoms    -  Primary   Bronchitis       Relevant Medications   methylPREDNISolone (MEDROL DOSEPAK) 4 MG TBPK tablet   Other Relevant Orders   DG Chest 2 View       Meds ordered this encounter  Medications   methylPREDNISolone (MEDROL DOSEPAK) 4 MG TBPK tablet    Sig: 6-day pack as directed    Dispense:  21 tablet    Refill:  0   Has had multiple rounds of antibiotics, not helping. Likely viral etiology. Out of window to screen for flu.  Will send for chest xray to rule out pneumonia. Add steroid dose pack and advised pt to start  using his albuterol inhaler along with Symbicort for now. Advised pt to see Pulmonologist and he reports he felt like it wasn't helpful. Past review of IgE labs was elevated in Jan 2023. Suspect allergic asthma component.   No follow-ups on file.  Suzan Slick, MD

## 2022-05-26 ENCOUNTER — Other Ambulatory Visit: Payer: Self-pay | Admitting: Family Medicine

## 2022-05-26 DIAGNOSIS — J40 Bronchitis, not specified as acute or chronic: Secondary | ICD-10-CM

## 2022-05-28 ENCOUNTER — Encounter: Payer: Self-pay | Admitting: Family Medicine

## 2022-05-28 ENCOUNTER — Other Ambulatory Visit: Payer: Self-pay | Admitting: Family Medicine

## 2022-05-28 DIAGNOSIS — R0981 Nasal congestion: Secondary | ICD-10-CM

## 2022-05-28 DIAGNOSIS — J40 Bronchitis, not specified as acute or chronic: Secondary | ICD-10-CM

## 2022-05-28 MED ORDER — FLUTICASONE PROPIONATE 50 MCG/ACT NA SUSP: 1.0000 | Freq: Every day | NASAL | 6 refills | Status: DC

## 2022-05-29 ENCOUNTER — Encounter: Payer: Self-pay | Admitting: Medical-Surgical

## 2022-05-29 ENCOUNTER — Ambulatory Visit (INDEPENDENT_AMBULATORY_CARE_PROVIDER_SITE_OTHER): Payer: BC Managed Care – PPO | Admitting: Sports Medicine

## 2022-05-29 ENCOUNTER — Encounter: Payer: Self-pay | Admitting: Sports Medicine

## 2022-05-29 VITALS — BP 136/74 | HR 95 | Ht 75.0 in | Wt 313.1 lb

## 2022-05-29 DIAGNOSIS — R430 Anosmia: Secondary | ICD-10-CM

## 2022-05-29 DIAGNOSIS — J40 Bronchitis, not specified as acute or chronic: Secondary | ICD-10-CM | POA: Diagnosis not present

## 2022-05-29 MED ORDER — METHYLPREDNISOLONE 4 MG PO TBPK: ORAL_TABLET | ORAL | 0 refills | Status: DC

## 2022-05-29 MED ORDER — AZELASTINE-FLUTICASONE 137-50 MCG/ACT NA SUSP: NASAL | 11 refills | Status: DC

## 2022-05-29 NOTE — Progress Notes (Signed)
    Procedures performed today:    None.  Independent interpretation of notes and tests performed by another provider:   None.  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 Unfortunately he has been for the most part without his sense of smell since March 2023, he has had multiple courses of antibiotics, steroids, typically with steroids he gains his sense of smell back and then it goes away. 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 Unfortunately Destyn has lost his sense of smell again. He does have a trip coming up to [place] and would like a course of steroids prior as this typically gets his sense of smell back. Happy to do this, I did explain the need to minimize systemic steroids.  We will also add nasal Dymista. 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 If insufficient long-term improvement his next follow-up will be back with his ENT.  Chronic process with exacerbation and pharmacologic dimension  ____________________________________________ Gwen Her. Dianah Field, M.D., ABFM., CAQSM., AME. Primary Care and Sports Medicine [facility]  Adjunct Professor of [place] [employer]  Risk manager

## 2022-05-29 NOTE — Assessment & Plan Note (Signed)
This is a very pleasant 37 year old male, he has had COVID a few times, last year he had COVID and then lost his sense of smell. Unfortunately he has been for the most part without his sense of smell since March 2023, he has had multiple courses of antibiotics, steroids, typically with steroids he gains his sense of smell back and then it goes away. 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 Unfortunately Bearl has lost his sense of smell again. He does have a trip coming up to [place] and would like a course of steroids prior as this typically gets his sense of smell back. Happy to do this, I did explain the need to minimize systemic steroids.  We will also add nasal Dymista. 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 If insufficient long-term improvement his next follow-up will be back with his ENT.

## 2022-06-12 ENCOUNTER — Encounter: Payer: Self-pay | Admitting: Sports Medicine

## 2022-10-23 ENCOUNTER — Other Ambulatory Visit: Payer: Self-pay | Admitting: Medical-Surgical

## 2022-10-23 NOTE — Telephone Encounter (Signed)
Requesting rx rf of albuterol HFA Last written 06/13/2021 Last OV 05/29/2022 with Dr. Benjamin Stain No appt with PCP Christen Butter, NP ) since 07/12/20 No upcoming appt schld.

## 2022-11-08 ENCOUNTER — Telehealth: Payer: Self-pay | Admitting: Medical-Surgical

## 2022-11-08 DIAGNOSIS — J339 Nasal polyp, unspecified: Secondary | ICD-10-CM | POA: Insufficient documentation

## 2022-11-08 MED ORDER — ALBUTEROL SULFATE HFA 108 (90 BASE) MCG/ACT IN AERS: 2.0000 | INHALATION_SPRAY | Freq: Four times a day (QID) | RESPIRATORY_TRACT | 0 refills | Status: DC | PRN

## 2022-11-08 NOTE — Telephone Encounter (Signed)
I have not see this patient since 2022. Since he saw Dr. Karie Schwalbe in February, I went ahead and sent the 1 month refill for Albuterol in but he will need an appointment for any further refils. ___________________________________________ Thayer Ohm, DNP, APRN, FNP-BC Primary Care and Sports Medicine [facility] [place]

## 2022-11-08 NOTE — Telephone Encounter (Signed)
Pt called. Following up on refill of Albuterol inhaler.

## 2023-01-02 ENCOUNTER — Ambulatory Visit (INDEPENDENT_AMBULATORY_CARE_PROVIDER_SITE_OTHER): Payer: BC Managed Care – PPO | Admitting: Medical-Surgical

## 2023-01-02 DIAGNOSIS — Z91199 Patient's noncompliance with other medical treatment and regimen due to unspecified reason: Secondary | ICD-10-CM

## 2023-01-02 NOTE — Progress Notes (Signed)
   Complete physical exam  Patient: Vincent Hale   DOB: 02/17/1997   37 y.o. Male  MRN: [id number]  Subjective:    No chief complaint on file.   Vincent Hale is a 37 y.o. male who presents today for a complete physical exam. She reports consuming a {diet types:17450} diet. {types:19826} She generally feels {DESC; WELL/FAIRLY WELL/POORLY:18703}. She reports sleeping {DESC; WELL/FAIRLY WELL/POORLY:18703}. She {does/does not:200015} have additional problems to discuss today.    Most recent fall risk assessment:    10/25/2021   10:42 AM  Fall Risk   Falls in the past year? 0  Number falls in past yr: 0  Injury with Fall? 0  Risk for fall due to : No Fall Risks  Follow up Falls evaluation completed     Most recent depression screenings:    10/25/2021   10:42 AM 09/15/2020   10:46 AM  PHQ 2/9 Scores  PHQ - 2 Score 0 0  PHQ- 9 Score 5     {VISON DENTAL STD PSA (Optional):27386}  {History (Optional):23778}  Patient Care Team: Jessup, Joy, NP as PCP - General (Nurse Practitioner)   Outpatient Medications Prior to Visit  Medication Sig   fluticasone (FLONASE) 50 MCG/ACT nasal spray Place 2 sprays into both nostrils in the morning and at bedtime. After 7 days, reduce to once daily.   norgestimate-ethinyl estradiol (SPRINTEC 28) 0.25-35 MG-MCG tablet Take 1 tablet by mouth daily.   Nystatin POWD Apply liberally to affected area 2 times per day   spironolactone (ALDACTONE) 100 MG tablet Take 1 tablet (100 mg total) by mouth daily.   No facility-administered medications prior to visit.    ROS        Objective:     There were no vitals taken for this visit. {Vitals History (Optional):23777}  Physical Exam   No results found for any visits on 11/30/21. {Show previous labs (optional):23779}    Assessment & Plan:    Routine Health Maintenance and Physical Exam  Immunization History  Administered Date(s) Administered   DTaP 05/03/1999, 06/29/1999,  09/07/1999, 05/23/2000, 12/07/2003   Hepatitis A 10/03/2007, 10/08/2008   Hepatitis B 02/18/1999, 03/28/1999, 09/07/1999   HiB (PRP-OMP) 05/03/1999, 06/29/1999, 09/07/1999, 05/23/2000   IPV 05/03/1999, 06/29/1999, 02/26/2000, 12/07/2003   Influenza,inj,Quad PF,6+ Mos 01/08/2014   Influenza-Unspecified 04/09/2012   MMR 02/25/2001, 12/07/2003   Meningococcal Polysaccharide 10/08/2011   Pneumococcal Conjugate-13 05/23/2000   Pneumococcal-Unspecified 09/07/1999, 11/21/1999   Tdap 10/08/2011   Varicella 02/26/2000, 10/03/2007    Health Maintenance  Topic Date Due   HIV Screening  Never done   Hepatitis C Screening  Never done   INFLUENZA VACCINE  11/28/2021   PAP-Cervical Cytology Screening  11/30/2021 (Originally 02/17/2020)   PAP SMEAR-Modifier  11/30/2021 (Originally 02/17/2020)   TETANUS/TDAP  11/30/2021 (Originally 10/07/2021)   HPV VACCINES  Discontinued   COVID-19 Vaccine  Discontinued    Discussed health benefits of physical activity, and encouraged her to engage in regular exercise appropriate for her age and condition.  Problem List Items Addressed This Visit   None Visit Diagnoses     Annual physical exam    -  Primary   Cervical cancer screening       Need for Tdap vaccination          No follow-ups on file.     Joy Jessup, NP   

## 2023-01-28 ENCOUNTER — Ambulatory Visit (INDEPENDENT_AMBULATORY_CARE_PROVIDER_SITE_OTHER): Payer: Managed Care, Other (non HMO) | Admitting: Medical-Surgical

## 2023-01-28 ENCOUNTER — Encounter: Payer: Self-pay | Admitting: Medical-Surgical

## 2023-01-28 VITALS — BP 135/78 | HR 104 | Resp 20 | Ht 75.0 in | Wt 323.1 lb

## 2023-01-28 DIAGNOSIS — J4541 Moderate persistent asthma with (acute) exacerbation: Secondary | ICD-10-CM | POA: Diagnosis not present

## 2023-01-28 DIAGNOSIS — Z13220 Encounter for screening for lipoid disorders: Secondary | ICD-10-CM

## 2023-01-28 DIAGNOSIS — Z0000 Encounter for general adult medical examination without abnormal findings: Secondary | ICD-10-CM

## 2023-01-28 DIAGNOSIS — R3589 Other polyuria: Secondary | ICD-10-CM | POA: Diagnosis not present

## 2023-01-28 MED ORDER — ALBUTEROL SULFATE HFA 108 (90 BASE) MCG/ACT IN AERS: 2.0000 | INHALATION_SPRAY | Freq: Four times a day (QID) | RESPIRATORY_TRACT | 11 refills | Status: AC | PRN

## 2023-01-28 NOTE — Progress Notes (Signed)
Complete physical exam  Patient: Vincent Hale   DOB: [birth year]   37 y.o. Male  MRN: [id number]  Subjective:    Chief Complaint  Patient presents with   Annual Exam   Vincent Hale is a 37 y.o. male who presents today for a complete physical exam. He reports consuming a general diet.  Lifting and doing cardio four times a week.  He generally feels well. He reports sleeping well. He does not have additional problems to discuss today.    Most recent fall risk assessment:    01/28/2023    1:41 PM  Fall Risk   Falls in the past year? 0  Number falls in past yr: 0  Injury with Fall? 0  Risk for fall due to : No Fall Risks  Follow up Falls evaluation completed     Most recent depression screenings:    01/28/2023    1:41 PM 07/12/2020   11:14 AM  PHQ 2/9 Scores  PHQ - 2 Score 0 1  PHQ- 9 Score  2    Vision:Within last year, Dental: No current dental problems and Receives regular dental care, and STD: The patient denies history of sexually transmitted disease.    Patient Care Team: Christen Butter, NP as PCP - General (Nurse Practitioner)   Outpatient Medications Prior to Visit  Medication Sig   [DISCONTINUED] albuterol (VENTOLIN HFA) 108 (90 Base) MCG/ACT inhaler Inhale 2 puffs into the lungs every 6 (six) hours as needed for wheezing or shortness of breath. NEEDS APPT FOR FURTHER REFILLS   [DISCONTINUED] Azelastine-Fluticasone 137-50 MCG/ACT SUSP One spray each nostril BID   [DISCONTINUED] budesonide-formoterol (SYMBICORT) 160-4.5 MCG/ACT inhaler Inhale 2 puffs into the lungs in the morning and at bedtime.   [DISCONTINUED] cetirizine (ZYRTEC) 10 MG tablet Take 10 mg by mouth daily.   [DISCONTINUED] methylPREDNISolone (MEDROL DOSEPAK) 4 MG TBPK tablet 6-day pack as directed   [DISCONTINUED] montelukast (SINGULAIR) 10 MG tablet Take 10 mg by mouth daily.   No facility-administered medications prior to visit.    Review of Systems  Constitutional:  Negative for  chills, fever, malaise/fatigue and weight loss.  HENT:  Negative for congestion, ear pain, hearing loss, sinus pain and sore throat.   Eyes:  Negative for blurred vision, photophobia and pain.  Respiratory:  Negative for cough, shortness of breath and wheezing.   Cardiovascular:  Negative for chest pain, palpitations and leg swelling.  Gastrointestinal:  Negative for abdominal pain, constipation, diarrhea, heartburn, nausea and vomiting.  Genitourinary:  Negative for dysuria, frequency and urgency.       Polyuria with nocturia   Musculoskeletal:  Positive for back pain (intermittently) and joint pain (bilateral knees intermittently). Negative for falls and neck pain.  Skin:  Negative for itching and rash.  Neurological:  Negative for dizziness, weakness and headaches.  Endo/Heme/Allergies:  Negative for polydipsia. Does not bruise/bleed easily.  Psychiatric/Behavioral:  Negative for depression, substance abuse and suicidal ideas. The patient is not nervous/anxious and does not have insomnia.      Objective:    BP 135/78 (BP Location: Right Arm, Cuff Size: Large)   Pulse (!) 104   Resp 20   Ht 6\' 3"  (1.905 m)   Wt (!) 323 lb 1.9 oz (146.6 kg)   SpO2 96%   BMI 40.39 kg/m    Physical Exam Constitutional:      General: He is not in acute distress.    Appearance: Normal appearance. He is not ill-appearing.  HENT:  Head: Normocephalic and atraumatic.     Right Ear: Tympanic membrane, ear canal and external ear normal. There is no impacted cerumen.     Left Ear: Tympanic membrane, ear canal and external ear normal. There is no impacted cerumen.     Nose: Nose normal. No congestion or rhinorrhea.     Mouth/Throat:     Mouth: Mucous membranes are moist.     Pharynx: No oropharyngeal exudate or posterior oropharyngeal erythema.  Eyes:     General: No scleral icterus.       Right eye: No discharge.        Left eye: No discharge.     Extraocular Movements: Extraocular movements  intact.     Conjunctiva/sclera: Conjunctivae normal.     Pupils: Pupils are equal, round, and reactive to light.  Neck:     Thyroid: No thyromegaly.     Vascular: No carotid bruit or JVD.     Trachea: Trachea normal.  Cardiovascular:     Rate and Rhythm: Normal rate and regular rhythm.     Pulses: Normal pulses.     Heart sounds: Normal heart sounds. No murmur heard.    No friction rub. No gallop.  Pulmonary:     Effort: Pulmonary effort is normal. No respiratory distress.     Breath sounds: Normal breath sounds. No wheezing.  Abdominal:     General: Bowel sounds are normal. There is no distension.     Palpations: Abdomen is soft.     Tenderness: There is no abdominal tenderness. There is no guarding.  Musculoskeletal:        General: Normal range of motion.     Cervical back: Normal range of motion and neck supple.  Lymphadenopathy:     Cervical: No cervical adenopathy.  Skin:    General: Skin is warm and dry.  Neurological:     Mental Status: He is alert and oriented to person, place, and time.     Cranial Nerves: No cranial nerve deficit.  Psychiatric:        Mood and Affect: Mood normal.        Behavior: Behavior normal.        Thought Content: Thought content normal.        Judgment: Judgment normal.      No results found for any visits on 01/28/23.     Assessment & Plan:    Routine Health Maintenance and Physical Exam  Immunization History  Administered Date(s) Administered   PFIZER(Purple Top)SARS-COV-2 Vaccination 07/26/2019, 08/16/2019, 03/23/2020    Health Maintenance  Topic Date Due   COVID-19 Vaccine (4 - 2023-24 season) 02/13/2023 (Originally 12/30/2022)   INFLUENZA VACCINE  07/29/2023 (Originally 11/29/2022)   Hepatitis C Screening  01/28/2024 (Originally 06/12/2005)   HIV Screening  Completed   HPV VACCINES  Aged Out   DTaP/Tdap/Td  Discontinued    Discussed health benefits of physical activity, and encouraged him to engage in regular exercise  appropriate for his age and condition.  1. Annual physical exam Checking labs as below.  Up-to-date on preventative care.  Wellness information provided with AVS. - CBC with Differential/Platelet - CMP14+EGFR  2. Moderate persistent asthma with acute exacerbation Overall well-controlled.  Refilling albuterol inhaler today.  3. Morbid obesity (HCC) Checking hemoglobin A1c and TSH today. - Hemoglobin A1c - TSH  4. Polyuria Concerning for prediabetes/diabetes.  Checking A1c. - Hemoglobin A1c  5. Lipid screening Checking lipids. - Lipid panel  Return in about 1 year (around 01/28/2024)  for annual physical exam or sooner if needed.   Christen Butter, NP

## 2023-01-28 NOTE — Patient Instructions (Signed)
Macro diet Reverse dieting

## 2023-01-29 LAB — CBC WITH DIFFERENTIAL/PLATELET
Basophils Absolute: 0 10*3/uL (ref 0.0–0.2)
Basos: 0 %
EOS (ABSOLUTE): 0 10*3/uL (ref 0.0–0.4)
Eos: 0 %
Hematocrit: 44 % (ref 37.5–51.0)
Hemoglobin: 14.5 g/dL (ref 13.0–17.7)
Immature Grans (Abs): 0.2 10*3/uL — ABNORMAL HIGH (ref 0.0–0.1)
Immature Granulocytes: 1 %
Lymphocytes Absolute: 2.2 10*3/uL (ref 0.7–3.1)
Lymphs: 13 %
MCH: 32.2 pg (ref 26.6–33.0)
MCHC: 33 g/dL (ref 31.5–35.7)
MCV: 98 fL — ABNORMAL HIGH (ref 79–97)
Monocytes Absolute: 1 10*3/uL — ABNORMAL HIGH (ref 0.1–0.9)
Monocytes: 6 %
Neutrophils Absolute: 12.7 10*3/uL — ABNORMAL HIGH (ref 1.4–7.0)
Neutrophils: 80 %
Platelets: 286 10*3/uL (ref 150–450)
RBC: 4.51 x10E6/uL (ref 4.14–5.80)
RDW: 13.4 % (ref 11.6–15.4)
WBC: 16.1 10*3/uL — ABNORMAL HIGH (ref 3.4–10.8)

## 2023-01-29 LAB — CMP14+EGFR
ALT: 24 [IU]/L (ref 0–44)
AST: 17 [IU]/L (ref 0–40)
Albumin: 4.1 g/dL (ref 4.1–5.1)
Alkaline Phosphatase: 93 [IU]/L (ref 44–121)
BUN/Creatinine Ratio: 14 (ref 9–20)
BUN: 13 mg/dL (ref 6–20)
Bilirubin Total: 0.2 mg/dL (ref 0.0–1.2)
CO2: 23 mmol/L (ref 20–29)
Calcium: 9.2 mg/dL (ref 8.7–10.2)
Chloride: 105 mmol/L (ref 96–106)
Creatinine, Ser: 0.91 mg/dL (ref 0.76–1.27)
Globulin, Total: 2.3 g/dL (ref 1.5–4.5)
Glucose: 123 mg/dL — ABNORMAL HIGH (ref 70–99)
Potassium: 3.8 mmol/L (ref 3.5–5.2)
Sodium: 142 mmol/L (ref 134–144)
Total Protein: 6.4 g/dL (ref 6.0–8.5)
eGFR: 113 mL/min/{1.73_m2} (ref >59)

## 2023-01-29 LAB — TSH: TSH: 0.077 u[IU]/mL — ABNORMAL LOW (ref 0.450–4.500)

## 2023-01-29 LAB — HEMOGLOBIN A1C
Est. average glucose Bld gHb Est-mCnc: 123 mg/dL
Hgb A1c MFr Bld: 5.9 % — ABNORMAL HIGH (ref 4.8–5.6)

## 2023-01-29 LAB — LIPID PANEL
Chol/HDL Ratio: 2.6 {ratio} (ref 0.0–5.0)
Cholesterol, Total: 159 mg/dL (ref 100–199)
HDL: 62 mg/dL (ref >39)
LDL Chol Calc (NIH): 79 mg/dL (ref 0–99)
Triglycerides: 97 mg/dL (ref 0–149)
VLDL Cholesterol Cal: 18 mg/dL (ref 5–40)

## 2023-02-21 DIAGNOSIS — R7989 Other specified abnormal findings of blood chemistry: Secondary | ICD-10-CM

## 2023-02-21 NOTE — Addendum Note (Signed)
Addended by: Isla Pence L on: 02/21/2023 11:08 AM   Modules accepted: Orders

## 2023-02-22 LAB — TSH+FREE T4
Free T4: 1.67 ng/dL (ref 0.82–1.77)
TSH: 0.043 u[IU]/mL — ABNORMAL LOW (ref 0.450–4.500)

## 2023-05-21 ENCOUNTER — Encounter: Payer: Self-pay | Admitting: Endocrinology

## 2023-05-21 ENCOUNTER — Ambulatory Visit (INDEPENDENT_AMBULATORY_CARE_PROVIDER_SITE_OTHER): Payer: Managed Care, Other (non HMO) | Admitting: Endocrinology

## 2023-05-21 VITALS — BP 132/80 | HR 88 | Resp 20 | Ht 75.0 in | Wt 323.4 lb

## 2023-05-21 DIAGNOSIS — E0590 Thyrotoxicosis, unspecified without thyrotoxic crisis or storm: Secondary | ICD-10-CM | POA: Diagnosis not present

## 2023-05-21 NOTE — Progress Notes (Addendum)
Outpatient Endocrinology Note Vincent Mearle Drew, MD   Patient's Name: Vincent Hale    DOB: 07-28-1985    MRN: [id number]  REASON OF VISIT: New consult  for low TSH  REFERRING PROVIDER: Christen Butter, NP  PCP: Christen Butter, NP  HISTORY OF PRESENT ILLNESS:   Vincent Hale is a 38 y.o. old male with past medical history as listed below is presented for new consult for subclinical hyperthyroidism.   Pertinent Thyroid History: - Patient had regular lab for TSH on January 28, 2023, noted to have low TSH of 0.077, repeat thyroid function test in February 21, 2023, remained low TSH of 0.043 and normal free T4 of 1.67.  Patient is referred to endocrinology for further evaluation and management of low TSH consistent with subclinical hyperthyroidism. -Patient recalls having symptoms of dry cough during that time he had thyroid function test.  He was not treated with glucocorticoid at that time.  He denies neck pain or fever.  He denies having palpitation, heat intolerance, weight loss or frequent bowel movement. -Family history of possible thyroid disorder in mother, does not recall details. -No neck discomfort or neck compressive symptoms.  Labs:  Latest Reference Range & Units 01/10/18 09:36 01/28/23 14:33 02/21/23 15:39  TSH 0.450 - 4.500 uIU/mL 1.23 0.077 (L) 0.043 (L)  T4,Free(Direct) 0.82 - 1.77 ng/dL   7.84  (L): Data is abnormally low  REVIEW OF SYSTEMS:  As per history of present illness.   PAST MEDICAL HISTORY: History reviewed. No pertinent past medical history.  PAST SURGICAL HISTORY: Past Surgical History:  Procedure Laterality Date   arthroscopic knee surgery     tear in meniscus  2009    ALLERGIES: No Known Allergies  FAMILY HISTORY:  Family History  Problem Relation Age of Onset   Arthritis Mother    Heart attack Father    Hyperlipidemia Father    Hypertension Father     SOCIAL HISTORY: Social History   Socioeconomic History   Marital status: Married     Spouse name: Not on file   Number of children: Not on file   Years of education: Not on file   Highest education level: Not on file  Occupational History   Not on file  Tobacco Use   Smoking status: Never   Smokeless tobacco: Never  Vaping Use   Vaping status: Never Used  Substance and Sexual Activity   Alcohol use: Yes    Comment: occasionally   Drug use: Never   Sexual activity: Yes    Birth control/protection: None  Other Topics Concern   Not on file  Social History Narrative   Not on file   Social Drivers of Health   Financial Resource Strain: Not on file  Food Insecurity: Not on file  Transportation Needs: Not on file  Physical Activity: Not on file  Stress: Not on file  Social Connections: Not on file    MEDICATIONS:  Current Outpatient Medications  Medication Sig Dispense Refill   Dupilumab (DUPIXENT [place]) Inject into the skin.     albuterol (VENTOLIN HFA) 108 (90 Base) MCG/ACT inhaler Inhale 2 puffs into the lungs every 6 (six) hours as needed for wheezing or shortness of breath. 18 each 11   No current facility-administered medications for this visit.    PHYSICAL EXAM: Vitals:   05/21/23 1028  BP: 132/80  Pulse: 88  Resp: 20  SpO2: 96%  Weight: (!) 323 lb 6.4 oz (146.7 kg)  Height: 6\' 3"  (1.905 m)  Body mass index is 40.42 kg/m.  Wt Readings from Last 3 Encounters:  05/21/23 (!) 323 lb 6.4 oz (146.7 kg)  01/28/23 (!) 323 lb 1.9 oz (146.6 kg)  05/29/22 (!) 313 lb 1.3 oz (142 kg)    General: Well developed, well nourished male in no apparent distress.  HEENT: AT/[place], no external lesions. Hearing intact to the spoken word Eyes: EOMI. No stare, proptosis or lid lag. Conjunctiva clear and no icterus. No erythema or watering Neck: Trachea midline, neck supple without appreciable thyromegaly or lymphadenopathy and no palpable thyroid nodules Lungs: Clear to auscultation, no wheeze. Respirations not labored Heart: S1S2, Regular in rate and rhythm. No  loud murmurs Abdomen: Soft, non tender, non distended Neurologic: Alert, oriented, normal speech, deep tendon biceps reflexes normal,  no gross focal neurological deficit Extremities: No pedal pitting edema, no tremors of outstretched hands Skin: Warm, color good.  Psychiatric: Does not appear depressed or anxious  PERTINENT HISTORIC LABORATORY AND IMAGING STUDIES:  All pertinent laboratory results were reviewed. Please see HPI also for further details.   TSH  Date Value Ref Range Status  02/21/2023 0.043 (L) 0.450 - 4.500 uIU/mL Final  01/28/2023 0.077 (L) 0.450 - 4.500 uIU/mL Final  01/10/2018 1.23 0.35 - 4.50 uIU/mL Final    Lab Results  Component Value Date   FREET4 1.67 02/21/2023   TSH 0.043 (L) 02/21/2023   TSH 0.077 (L) 01/28/2023   TSH 1.23 01/10/2018    No results found for: "THYROTRECAB"  Lab Results  Component Value Date   TSH 0.043 (L) 02/21/2023   TSH 0.077 (L) 01/28/2023   TSH 1.23 01/10/2018   FREET4 1.67 02/21/2023     No results found for: "TSI"   No components found for: "TRAB"    ASSESSMENT / PLAN  1. Subclinical hyperthyroidism    -Patient had thyroid function test consistent with low TSH with subclinical hyperthyroidism in September/October 2024.  He denies, does not recall hyperthyroid symptoms.  He had cough during the daytime. -Discussed various causes of hyperthyroidism including subacute thyroiditis which can be related with having upper respiratory tract infection, other causes includes hyperfunctioning thyroid nodule or autoimmune hyperthyroidism/Graves' disease.  He is clinically euthyroid in the clinic today.  It is possible that he might have  thyroiditis causing transient hyperthyroidism.   Plan: -Check thyroid function test TSH, free T4, free T3.  If it is related to thyroiditis expected to have normal thyroid function test. -I would also like to check thyroid autoantibodies including TRAb, TSI, anti-TPO antibody and thyroglobulin  antibody to check for Hashimoto thyroiditis and Graves' disease. -Will consider for further workup if needed based on lab results which includes ultrasound thyroid /RAI nuclear scan, no plan for these tests at this time.  Diagnoses and all orders for this visit:  Subclinical hyperthyroidism -     T3, free -     T4, free -     TSH -     TRAb (TSH Receptor Binding Antibody) -     Thyroglobulin antibody -     Thyroid peroxidase antibody -     Thyroid stimulating immunoglobulin   Normal thyroid function test, normal thyroid autoantibodies.  Overall reassuring.  No routine endocrinology follow-up is required.  Reasonable to check blood test for thyroid function test annually for couple of years, after that when symptomatic can be done with primary care provider.  Will route this note to the primary care provider.   Latest Reference Range & Units 05/21/23 11:05  TSH 0.40 - 4.50 mIU/L 0.68  Triiodothyronine,Free,Serum 2.3 - 4.2 pg/mL 3.6  T4,Free(Direct) 0.8 - 1.8 ng/dL 1.4  Thyroglobulin Ab < or = 1 IU/mL <1  Thyroperoxidase Ab SerPl-aCnc <9 IU/mL 1  THYROID STIMULATING IMMUNOGLOBULIN  Rpt  TSI <140 % baseline <89  Rpt: View report in Results Review for more information   Latest Reference Range & Units 05/21/23 11:05  TRAB <=2.00 IU/L <1.00    DISPOSITION Follow up in clinic in to be determined based on above plan.  All questions answered and patient verbalized understanding of the plan.  Vincent Jontrell Bushong, MD [facility] Endocrinology [facility] [address] [place], [address] [place], [place] [address] Phone # [phone]   At least part of this note was generated using voice recognition software. Inadvertent word errors may have occurred, which were not recognized during the proofreading process.

## 2023-05-22 ENCOUNTER — Encounter: Payer: Self-pay | Admitting: Endocrinology

## 2023-05-24 LAB — T4, FREE: Free T4: 1.4 ng/dL (ref 0.8–1.8)

## 2023-05-24 LAB — THYROID STIMULATING IMMUNOGLOBULIN: TSI: <89 % baseline (ref <140)

## 2023-05-24 LAB — T3, FREE: T3, Free: 3.6 pg/mL (ref 2.3–4.2)

## 2023-05-24 LAB — THYROGLOBULIN ANTIBODY: Thyroglobulin Ab: <1 [IU]/mL (ref <=1)

## 2023-05-24 LAB — TRAB (TSH RECEPTOR BINDING ANTIBODY): TRAB: <1 [IU]/L (ref <=2.00)

## 2023-05-24 LAB — THYROID PEROXIDASE ANTIBODY: Thyroperoxidase Ab SerPl-aCnc: 1 [IU]/mL (ref <9)

## 2023-05-24 LAB — TSH: TSH: 0.68 m[IU]/L (ref 0.40–4.50)

## 2023-05-27 ENCOUNTER — Telehealth: Payer: Self-pay

## 2023-05-27 NOTE — Telephone Encounter (Signed)
-----   Message from [place] Th[place] sent at 05/27/2023  2:50 PM EST ----- Please notify patient of Normal thyroid function test, normal thyroid autoantibodies.  Overall reassuring.  No routine endocrinology follow-up is required.  Reasonable to check blood test for thyroid function test annually for couple of years, after that when symptomatic can be done with primary care provider.

## 2023-05-27 NOTE — Telephone Encounter (Signed)
Patient given results  as directed by MD.

## 2023-12-31 ENCOUNTER — Encounter: Payer: Self-pay | Admitting: Sports Medicine

## 2024-01-28 DIAGNOSIS — D239 Other benign neoplasm of skin, unspecified: Secondary | ICD-10-CM | POA: Insufficient documentation

## 2024-01-28 NOTE — Progress Notes (Deleted)
   Complete physical exam  Patient: Vincent Hale   DOB: [birth year]   38 y.o. Male  MRN: [id number]  Subjective:    No chief complaint on file.   Vincent Hale is a 38 y.o. male who presents today for a complete physical exam. He reports consuming a {diet types:17450} diet. {types:19826} He generally feels {DESC; WELL/FAIRLY WELL/POORLY:18703}. He reports sleeping {DESC; WELL/FAIRLY WELL/POORLY:18703}. He {does/does not:200015} have additional problems to discuss today.    Most recent fall risk assessment:    01/28/2023    1:41 PM  Fall Risk   Falls in the past year? 0  Number falls in past yr: 0  Injury with Fall? 0  Risk for fall due to : No Fall Risks  Follow up Falls evaluation completed     Most recent depression screenings:    01/28/2023    1:41 PM 07/12/2020   11:14 AM  PHQ 2/9 Scores  PHQ - 2 Score 0 1  PHQ- 9 Score  2    {VISON DENTAL STD PSA (Optional):27386}  {History (Optional):23778}  Patient Care Team: Willo Mini, NP as PCP - General (Nurse Practitioner)   Outpatient Medications Prior to Visit  Medication Sig   Dupilumab 300 MG/2ML SOAJ Inject 300 mg into the skin.   albuterol  (VENTOLIN  HFA) 108 (90 Base) MCG/ACT inhaler Inhale 2 puffs into the lungs every 6 (six) hours as needed for wheezing or shortness of breath.   [DISCONTINUED] Dupilumab (DUPIXENT [place]) Inject into the skin.   No facility-administered medications prior to visit.    ROS        Objective:     There were no vitals taken for this visit. {Vitals History (Optional):23777}  Physical Exam   No results found for any visits on 01/29/24. {Show previous labs (optional):23779}    Assessment & Plan:    Routine Health Maintenance and Physical Exam  Immunization History  Administered Date(s) Administered   PFIZER(Purple Top)SARS-COV-2 Vaccination 07/26/2019, 08/16/2019, 03/23/2020    Health Maintenance  Topic Date Due   Pneumococcal Vaccine (1 of 2 - PCV) Never done    Hepatitis B Vaccines 19-59 Average Risk (1 of 3 - 19+ 3-dose series) Never done   HPV VACCINES (1 - 3-dose SCDM series) Never done   Influenza Vaccine  Never done   COVID-19 Vaccine (4 - 2025-26 season) 12/30/2023   Hepatitis C Screening  01/28/2024 (Originally 06/12/2005)   HIV Screening  Completed   Meningococcal B Vaccine  Aged Out   DTaP/Tdap/Td  Discontinued    Discussed health benefits of physical activity, and encouraged him to engage in regular exercise appropriate for his age and condition.  Problem List Items Addressed This Visit       Respiratory   Moderate persistent asthma with acute exacerbation     Other   Morbid obesity (HCC)   Other Visit Diagnoses       Annual physical exam    -  Primary      No follow-ups on file.     Versie Fleener, NP

## 2024-01-28 NOTE — Patient Instructions (Incomplete)
 Preventive Care 11-36 Years Old, Male Preventive care refers to lifestyle choices and visits with your health care provider that can promote health and wellness. Preventive care visits are also called wellness exams. What can I expect for my preventive care visit? Counseling During your preventive care visit, your health care provider may ask about your: Medical history, including: Past medical problems. Family medical history. Current health, including: Emotional well-being. Home life and relationship well-being. Sexual activity. Lifestyle, including: Alcohol, nicotine or tobacco, and drug use. Access to firearms. Diet, exercise, and sleep habits. Safety issues such as seatbelt and bike helmet use. Sunscreen use. Work and work Astronomer. Physical exam Your health care provider may check your: Height and weight. These may be used to calculate your BMI (body mass index). BMI is a measurement that tells if you are at a healthy weight. Waist circumference. This measures the distance around your waistline. This measurement also tells if you are at a healthy weight and may help predict your risk of certain diseases, such as type 2 diabetes and high blood pressure. Heart rate and blood pressure. Body temperature. Skin for abnormal spots. What immunizations do I need?  Vaccines are usually given at various ages, according to a schedule. Your health care provider will recommend vaccines for you based on your age, medical history, and lifestyle or other factors, such as travel or where you work. What tests do I need? Screening Your health care provider may recommend screening tests for certain conditions. This may include: Lipid and cholesterol levels. Diabetes screening. This is done by checking your blood sugar (glucose) after you have not eaten for a while (fasting). Hepatitis B test. Hepatitis C test. HIV (human immunodeficiency virus) test. STI (sexually transmitted infection)  testing, if you are at risk. Talk with your health care provider about your test results, treatment options, and if necessary, the need for more tests. Follow these instructions at home: Eating and drinking  Eat a healthy diet that includes fresh fruits and vegetables, whole grains, lean protein, and low-fat dairy products. Drink enough fluid to keep your urine pale yellow. Take vitamin and mineral supplements as recommended by your health care provider. Do not drink alcohol if your health care provider tells you not to drink. If you drink alcohol: Limit how much you have to 0-2 drinks a day. Know how much alcohol is in your drink. In the [place] one drink equals one 12 oz bottle of beer (355 mL), one 5 oz glass of wine (148 mL), or one 1 oz glass of hard liquor (44 mL). Lifestyle Brush your teeth every morning and night with fluoride toothpaste. Floss one time each day. Exercise for at least 30 minutes 5 or more days each week. Do not use any products that contain nicotine or tobacco. These products include cigarettes, chewing tobacco, and vaping devices, such as e-cigarettes. If you need help quitting, ask your health care provider. Do not use drugs. If you are sexually active, practice safe sex. Use a condom or other form of protection to prevent STIs. Find healthy ways to manage stress, such as: Meditation, yoga, or listening to music. Journaling. Talking to a trusted person. Spending time with friends and family. Minimize exposure to UV radiation to reduce your risk of skin cancer. Safety Always wear your seat belt while driving or riding in a vehicle. Do not drive: If you have been drinking alcohol. Do not ride with someone who has been drinking. If you have been using any mind-altering substances  or drugs. While texting. When you are tired or distracted. Wear a helmet and other protective equipment during sports activities. If you have firearms in your house, make sure you  follow all gun safety procedures. Seek help if you have been physically or sexually abused. What's next? Go to your health care provider once a year for an annual wellness visit. Ask your health care provider how often you should have your eyes and teeth checked. Stay up to date on all vaccines. This information is not intended to replace advice given to you by your health care provider. Make sure you discuss any questions you have with your health care provider. Document Revised: 10/12/2020 Document Reviewed: 10/12/2020 Elsevier Patient Education  2024 [employer].

## 2024-01-29 ENCOUNTER — Encounter: Payer: Managed Care, Other (non HMO) | Admitting: Medical-Surgical

## 2024-01-29 DIAGNOSIS — Z0000 Encounter for general adult medical examination without abnormal findings: Secondary | ICD-10-CM

## 2024-01-29 DIAGNOSIS — J4541 Moderate persistent asthma with (acute) exacerbation: Secondary | ICD-10-CM
